# Patient Record
Sex: Female | Born: 1945
Health system: Southern US, Community
[De-identification: ages and names within clinical notes are randomized; demographics above are authoritative.]

## PROBLEM LIST (undated history)

## (undated) DIAGNOSIS — K5790 Diverticulosis of intestine, part unspecified, without perforation or abscess without bleeding: Secondary | ICD-10-CM

## (undated) DIAGNOSIS — N200 Calculus of kidney: Secondary | ICD-10-CM

## (undated) DIAGNOSIS — K589 Irritable bowel syndrome without diarrhea: Secondary | ICD-10-CM

## (undated) DIAGNOSIS — I10 Essential (primary) hypertension: Secondary | ICD-10-CM

## (undated) DIAGNOSIS — K219 Gastro-esophageal reflux disease without esophagitis: Secondary | ICD-10-CM

## (undated) DIAGNOSIS — D649 Anemia, unspecified: Secondary | ICD-10-CM

## (undated) DIAGNOSIS — E785 Hyperlipidemia, unspecified: Secondary | ICD-10-CM

## (undated) DIAGNOSIS — M1990 Unspecified osteoarthritis, unspecified site: Secondary | ICD-10-CM

## (undated) HISTORY — PX: BREAST LUMPECTOMY: SHX2

## (undated) HISTORY — PX: OTHER SURGICAL HISTORY: SHX169

## (undated) HISTORY — DX: Essential (primary) hypertension: I10

## (undated) HISTORY — DX: Gastro-esophageal reflux disease without esophagitis: K21.9

## (undated) HISTORY — DX: Unspecified osteoarthritis, unspecified site: M19.90

## (undated) HISTORY — PX: CYSTOCELE REPAIR: SHX163

## (undated) HISTORY — DX: Calculus of kidney: N20.0

## (undated) HISTORY — DX: Anemia, unspecified: D64.9

## (undated) HISTORY — PX: ABDOMINAL HYSTERECTOMY: SHX81

## (undated) HISTORY — DX: Irritable bowel syndrome without diarrhea: K58.9

## (undated) HISTORY — DX: Hyperlipidemia, unspecified: E78.5

## (undated) HISTORY — DX: Diverticulosis of intestine, part unspecified, without perforation or abscess without bleeding: K57.90

---

## 1968-12-11 HISTORY — PX: ABDOMINAL EXPLORATION SURGERY: SHX538

## 2000-04-27 ENCOUNTER — Encounter: Payer: Self-pay | Admitting: Emergency Medicine

## 2000-04-27 ENCOUNTER — Emergency Department (HOSPITAL_COMMUNITY): Admission: EM | Admit: 2000-04-27 | Discharge: 2000-04-28 | Payer: Self-pay | Admitting: Emergency Medicine

## 2000-06-29 ENCOUNTER — Other Ambulatory Visit: Admission: RE | Admit: 2000-06-29 | Discharge: 2000-06-29 | Payer: Self-pay | Admitting: Obstetrics and Gynecology

## 2000-12-28 ENCOUNTER — Encounter: Payer: Self-pay | Admitting: Gastroenterology

## 2001-07-05 ENCOUNTER — Other Ambulatory Visit: Admission: RE | Admit: 2001-07-05 | Discharge: 2001-07-05 | Payer: Self-pay | Admitting: Obstetrics and Gynecology

## 2001-07-11 ENCOUNTER — Ambulatory Visit (HOSPITAL_COMMUNITY): Admission: RE | Admit: 2001-07-11 | Discharge: 2001-07-11 | Payer: Self-pay | Admitting: Family Medicine

## 2001-07-11 ENCOUNTER — Encounter: Payer: Self-pay | Admitting: Family Medicine

## 2001-12-09 ENCOUNTER — Encounter: Payer: Self-pay | Admitting: Family Medicine

## 2001-12-09 ENCOUNTER — Ambulatory Visit (HOSPITAL_COMMUNITY): Admission: RE | Admit: 2001-12-09 | Discharge: 2001-12-09 | Payer: Self-pay | Admitting: Family Medicine

## 2002-06-09 ENCOUNTER — Encounter: Payer: Self-pay | Admitting: Family Medicine

## 2002-06-09 ENCOUNTER — Ambulatory Visit (HOSPITAL_COMMUNITY): Admission: RE | Admit: 2002-06-09 | Discharge: 2002-06-09 | Payer: Self-pay | Admitting: Family Medicine

## 2002-09-08 ENCOUNTER — Ambulatory Visit (HOSPITAL_COMMUNITY): Admission: RE | Admit: 2002-09-08 | Discharge: 2002-09-08 | Payer: Self-pay | Admitting: Family Medicine

## 2002-09-08 ENCOUNTER — Encounter: Payer: Self-pay | Admitting: Family Medicine

## 2002-10-01 ENCOUNTER — Encounter: Payer: Self-pay | Admitting: Family Medicine

## 2002-10-01 ENCOUNTER — Ambulatory Visit (HOSPITAL_COMMUNITY): Admission: RE | Admit: 2002-10-01 | Discharge: 2002-10-01 | Payer: Self-pay | Admitting: Family Medicine

## 2002-10-17 ENCOUNTER — Encounter: Admission: RE | Admit: 2002-10-17 | Discharge: 2002-10-17 | Payer: Self-pay | Admitting: Oncology

## 2002-10-17 ENCOUNTER — Encounter (HOSPITAL_COMMUNITY): Admission: RE | Admit: 2002-10-17 | Discharge: 2002-11-16 | Payer: Self-pay | Admitting: Oncology

## 2002-12-10 ENCOUNTER — Encounter: Admission: RE | Admit: 2002-12-10 | Discharge: 2002-12-10 | Payer: Self-pay | Admitting: Oncology

## 2002-12-10 ENCOUNTER — Encounter (HOSPITAL_COMMUNITY): Admission: RE | Admit: 2002-12-10 | Discharge: 2003-01-09 | Payer: Self-pay | Admitting: Oncology

## 2003-03-13 ENCOUNTER — Encounter: Admission: RE | Admit: 2003-03-13 | Discharge: 2003-03-13 | Payer: Self-pay | Admitting: Oncology

## 2003-03-13 ENCOUNTER — Encounter (HOSPITAL_COMMUNITY): Admission: RE | Admit: 2003-03-13 | Discharge: 2003-04-12 | Payer: Self-pay | Admitting: Oncology

## 2003-03-23 ENCOUNTER — Encounter: Payer: Self-pay | Admitting: Obstetrics and Gynecology

## 2003-03-23 ENCOUNTER — Ambulatory Visit (HOSPITAL_COMMUNITY): Admission: RE | Admit: 2003-03-23 | Discharge: 2003-03-23 | Payer: Self-pay | Admitting: Obstetrics and Gynecology

## 2003-06-10 ENCOUNTER — Encounter: Admission: RE | Admit: 2003-06-10 | Discharge: 2003-06-10 | Payer: Self-pay | Admitting: Oncology

## 2003-07-14 ENCOUNTER — Encounter: Admission: RE | Admit: 2003-07-14 | Discharge: 2003-07-14 | Payer: Self-pay | Admitting: Oncology

## 2003-08-18 ENCOUNTER — Encounter (HOSPITAL_COMMUNITY): Admission: RE | Admit: 2003-08-18 | Discharge: 2003-09-10 | Payer: Self-pay | Admitting: Oncology

## 2003-08-18 ENCOUNTER — Encounter: Admission: RE | Admit: 2003-08-18 | Discharge: 2003-08-18 | Payer: Self-pay | Admitting: Oncology

## 2003-10-29 ENCOUNTER — Ambulatory Visit (HOSPITAL_COMMUNITY): Admission: RE | Admit: 2003-10-29 | Discharge: 2003-10-29 | Payer: Self-pay | Admitting: Orthopedic Surgery

## 2003-10-29 ENCOUNTER — Ambulatory Visit (HOSPITAL_BASED_OUTPATIENT_CLINIC_OR_DEPARTMENT_OTHER): Admission: RE | Admit: 2003-10-29 | Discharge: 2003-10-29 | Payer: Self-pay | Admitting: Orthopedic Surgery

## 2004-01-13 ENCOUNTER — Ambulatory Visit (HOSPITAL_COMMUNITY): Admission: RE | Admit: 2004-01-13 | Discharge: 2004-01-13 | Payer: Self-pay | Admitting: Family Medicine

## 2004-03-16 ENCOUNTER — Encounter: Admission: RE | Admit: 2004-03-16 | Discharge: 2004-03-16 | Payer: Self-pay | Admitting: Oncology

## 2005-04-06 ENCOUNTER — Ambulatory Visit (HOSPITAL_COMMUNITY): Admission: RE | Admit: 2005-04-06 | Discharge: 2005-04-06 | Payer: Self-pay | Admitting: Obstetrics and Gynecology

## 2006-03-26 ENCOUNTER — Ambulatory Visit (HOSPITAL_COMMUNITY): Admission: RE | Admit: 2006-03-26 | Discharge: 2006-03-26 | Payer: Self-pay | Admitting: Family Medicine

## 2006-05-21 ENCOUNTER — Ambulatory Visit (HOSPITAL_COMMUNITY): Admission: RE | Admit: 2006-05-21 | Discharge: 2006-05-21 | Payer: Self-pay | Admitting: Obstetrics and Gynecology

## 2007-01-30 ENCOUNTER — Emergency Department (HOSPITAL_COMMUNITY): Admission: EM | Admit: 2007-01-30 | Discharge: 2007-01-30 | Payer: Self-pay | Admitting: Emergency Medicine

## 2007-02-02 ENCOUNTER — Emergency Department (HOSPITAL_COMMUNITY): Admission: EM | Admit: 2007-02-02 | Discharge: 2007-02-02 | Payer: Self-pay | Admitting: Emergency Medicine

## 2007-04-12 ENCOUNTER — Inpatient Hospital Stay (HOSPITAL_COMMUNITY): Admission: AD | Admit: 2007-04-12 | Discharge: 2007-04-19 | Payer: Medicare Other | Admitting: Family Medicine

## 2007-04-14 ENCOUNTER — Ambulatory Visit: Payer: Self-pay | Admitting: Internal Medicine

## 2007-04-15 ENCOUNTER — Ambulatory Visit: Payer: Self-pay | Admitting: Internal Medicine

## 2007-04-16 ENCOUNTER — Ambulatory Visit: Payer: Self-pay | Admitting: Internal Medicine

## 2007-07-01 ENCOUNTER — Ambulatory Visit (HOSPITAL_COMMUNITY): Admission: RE | Admit: 2007-07-01 | Discharge: 2007-07-01 | Payer: Self-pay | Admitting: Obstetrics and Gynecology

## 2007-12-09 ENCOUNTER — Encounter (INDEPENDENT_AMBULATORY_CARE_PROVIDER_SITE_OTHER): Payer: Self-pay | Admitting: *Deleted

## 2007-12-09 ENCOUNTER — Ambulatory Visit (HOSPITAL_COMMUNITY): Admission: RE | Admit: 2007-12-09 | Discharge: 2007-12-09 | Payer: Self-pay | Admitting: Family Medicine

## 2008-10-22 ENCOUNTER — Ambulatory Visit (HOSPITAL_COMMUNITY): Admission: RE | Admit: 2008-10-22 | Discharge: 2008-10-22 | Payer: Self-pay | Admitting: Internal Medicine

## 2008-11-20 ENCOUNTER — Encounter (INDEPENDENT_AMBULATORY_CARE_PROVIDER_SITE_OTHER): Payer: Self-pay | Admitting: *Deleted

## 2008-11-20 ENCOUNTER — Ambulatory Visit (HOSPITAL_COMMUNITY): Admission: RE | Admit: 2008-11-20 | Discharge: 2008-11-20 | Payer: Self-pay | Admitting: Family Medicine

## 2009-04-26 ENCOUNTER — Encounter (INDEPENDENT_AMBULATORY_CARE_PROVIDER_SITE_OTHER): Payer: Self-pay | Admitting: *Deleted

## 2009-04-26 ENCOUNTER — Ambulatory Visit (HOSPITAL_COMMUNITY): Admission: RE | Admit: 2009-04-26 | Discharge: 2009-04-26 | Payer: Self-pay | Admitting: Family Medicine

## 2009-04-28 ENCOUNTER — Ambulatory Visit: Payer: Self-pay | Admitting: Internal Medicine

## 2009-04-28 DIAGNOSIS — K589 Irritable bowel syndrome without diarrhea: Secondary | ICD-10-CM | POA: Insufficient documentation

## 2009-04-28 DIAGNOSIS — R1012 Left upper quadrant pain: Secondary | ICD-10-CM | POA: Insufficient documentation

## 2009-04-28 DIAGNOSIS — K219 Gastro-esophageal reflux disease without esophagitis: Secondary | ICD-10-CM | POA: Insufficient documentation

## 2009-05-11 ENCOUNTER — Ambulatory Visit: Payer: Self-pay | Admitting: Internal Medicine

## 2009-05-12 ENCOUNTER — Telehealth (INDEPENDENT_AMBULATORY_CARE_PROVIDER_SITE_OTHER): Payer: Self-pay | Admitting: *Deleted

## 2009-05-12 ENCOUNTER — Telehealth: Payer: Self-pay | Admitting: Internal Medicine

## 2009-05-20 ENCOUNTER — Ambulatory Visit (HOSPITAL_COMMUNITY): Admission: RE | Admit: 2009-05-20 | Discharge: 2009-05-20 | Payer: Self-pay | Admitting: Gastroenterology

## 2009-05-20 ENCOUNTER — Ambulatory Visit: Payer: Self-pay | Admitting: Gastroenterology

## 2010-05-19 ENCOUNTER — Encounter: Admission: RE | Admit: 2010-05-19 | Discharge: 2010-05-19 | Payer: Self-pay | Admitting: Internal Medicine

## 2011-01-01 ENCOUNTER — Encounter: Payer: Self-pay | Admitting: Family Medicine

## 2011-01-01 ENCOUNTER — Encounter: Payer: Self-pay | Admitting: Internal Medicine

## 2011-01-02 ENCOUNTER — Encounter: Payer: Self-pay | Admitting: Family Medicine

## 2011-03-20 LAB — GLUCOSE, CAPILLARY

## 2011-04-28 NOTE — Discharge Summary (Signed)
NAMEGEORGANN, [name] NO.:  [id number]   MEDICAL RECORD NO.:  [id number]          PATIENT TYPE:  INP   LOCATION:  A218                          FACILITY:  [facility]   PHYSICIAN:  Patrica Duel, M.D.    DATE OF BIRTH:  1946/07/26   DATE OF ADMISSION:  04/12/2007  DATE OF DISCHARGE:  05/09/2008LH                               DISCHARGE SUMMARY   DISCHARGE DIAGNOSES:  1. Flank pain of questionable etiology spontaneous resolution on      acyclovir.  2. Apparent nephritis with transient renal failure, baseline renal      function at discharge.  No clear etiology determined.  3. Diabetes mellitus well controlled.  4. Mild hyperlipidemia edema.  5. Mild hypertension.  6. History of recurring epistaxis requiring cautery with no      recurrence.  7. Hormone-replacement therapy.   HISTORY OF PRESENT ILLNESS:  For details regarding admission please  refer to the admitting note.  Briefly, this very pleasant 65 year old  female with the above history developed a pleuritic sensation in her  right flank approximately 5 days prior to admission.  This has  progressed to the point of a debilitating colicky severe pain isolated  to her flank.  She underwent outpatient CT scanning (unenhanced  urogram).  This was totally unrevealing.  Her urine is clear and she was  treated symptomatically.  She called to say that her pain had increased  to the point that it was intolerable.  She was admitted for further  evaluation and therapy as flank pain of unknown etiology.   COURSE IN THE HOSPITAL:  The patient was treated symptomatically.  CT  scan with contrast was obtained which revealed no significant  abnormalities.  An ultrasound was also obtained which was unrevealing.  She had a hepatobiliary scan ordered and she declined the test hoping  for it to be done as an outpatient.  She continued to have nausea and  vomiting, and was treated symptomatically.  GI was consulted and they  had  little to offer.  Quite surprisingly her creatinine went from less  than 1-4.5 in a matter of 3 days.  This was repeated and was accurate.  Dr. Kristian Covey was consulted.  She was treated with high volume fluids and  diuretics and this was very effective and her creatinine promptly  returned to normal after 4 days of hydration, etc.   The patient's pain has resolved, the etiology still remains unclear.  Varicella zoster titers were positive with elevated levels of IgG but  normal IgM.  Whatever the case, this pain has resolved, renal functions  have returned to normal and she is stable for discharge.   Of note the patient has a chronic iron deficiency anemia and this will  be worked up as an outpatient.   DISPOSITION:  1. She will continue Premarin 0.625.  2. Avandamet 2/500 1 twice daily.  3. Metoprolol 50 twice daily.  4. Vytorin 10/20 daily.   FOLLOW-UP:  She will be followed in three weeks as an outpatient.      Patrica Duel, M.D.  Electronically Signed     MC/MEDQ  D:  05/06/2007  T:  05/06/2007  Job:  [id number]

## 2011-04-28 NOTE — Consult Note (Signed)
NAMECAYLEA, [name] NO.:  [id number]   MEDICAL RECORD NO.:  [id number]          PATIENT TYPE:  INP   LOCATION:  A209                          FACILITY:  [facility]   PHYSICIAN:  Jorja Loa, M.D.DATE OF BIRTH:  [birth year]   DATE OF CONSULTATION:  04/16/2007  DATE OF DISCHARGE:                                 CONSULTATION   REASON FOR CONSULTATION:  Elevated BUN and creatinine.  This is a 65year-  old with a history of mild hypertension, history of diabetes and also a  history of hyperlipidemia.  Presently admitted because of flank pain,  poor appetite, some nausea and also some the diarrhea.  She states that  about 4 or 5 days ago she started having some pain at the back which is  not radiating.  This is also associated with some abdominal pain and  severe with nausea, some vomiting, also infrequent diarrhea.  She denies  any fevers, chills or sweating.  She denies any previous history of  renal failure.  However, the patient states that she has previous  history of a kidney stone.   PAST MEDICAL HISTORY:  As stated above, the patient with history of  hypertension, history of diabetes for about 8 years history of  dyslipidemia and also history of previous abdominal pain with  laparotomy.   MEDICATIONS:  Her medications at this moment consist of a Acyclovir 670  mg IV daily, insulin 11 units subcu,  Levaquin 500 mg IV q. 24 hours,  Reglan 10 mg q. 6 hours, Lopressor 50 mg daily and she is getting IV  fluid at 50 cc/hour.  Other medications are on p.r.n. basis.   ALLERGIES:  No known allergies.   SOCIAL HISTORY:  No history of smoking and no history of alcohol abuse.  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   FAMILY HISTORY:  No history of renal insufficiency.   REVIEW OF SYSTEMS:  She is feeling better now but still she has some  back pain, flank pain.  She does not have any nausea or vomiting but  says she has poor appetite and she denies  any fevers, chills or  sweating.  She also denies any diarrhea and no swelling of the legs.   PHYSICAL EXAMINATION:  VITAL SIGNS:  On examination her blood pressure  is 163/83, temperature is 98.5, pulse of 85, respiratory rate is 18.  HEENT EXAM:  No conjunctival pallor.  No icterus.  Oral mucosa seems to  be dry.  NECK:  Supple.  No JVD.  CHEST:  Clear to auscultation.  No rales or rhonchi.  No egophony.  HEART:  Exam revealed regular rate and rhythm.  No murmur.  ABDOMEN:  Soft, positive bowel sounds.  EXTREMITIES:  No edema.   LABORATORY DATA:  Her white blood cell count is 10.9, hemoglobin is  10.9, hematocrit is 31.1.  Her sodium is 141, potassium 4.2, BUN is 28,  creatinine today is 4.57.  Just about 5 days ago creatinine was  0.48  and BUN of 70.  Previously during this admission she  has albumin of 3.1  and protein of 5.5, calcium 7.7.  Her UA specific gravity is 1.65, pH of  6.  She has blood trace, protein trace, few squamous cells.  She has  ultrasound which was done with her abdominal workup, which did not show  any kidney stone.  No hydro and right kidney is 12.6 and left kidney  stones 12.4.   ASSESSMENT:  1. Renal insufficiency. At this moment seems to be acute but there is      a significant change if this is truly her blood work.  The etiology      could be probably a combination of the dehydration associated with      nausea and vomiting and some diarrhea, combined with probably her      Acyclovir.  Even though the patient has history of diabetes at this      moment diabetic nephropathy and hypertensive nephrosclerosis seems      to be less likely.  2. History of diabetes.  She is on insulin.  Blood sugar usually is      controlled very well.  3. History of hypertension.  She is on metoprolol.  Blood pressure      seems to be controlled very well.  4. History of flank pain.  At this moment thought to be secondary to      Herpes Zoster.  She is on Acyclovir.  5.  History of nausea and also back pain, probably from herpes zoster      but other etiologies cannot be ruled out.   RECOMMENDATIONS:  Will repeat her blood work today.  We will increase  her IV fluids to 135 mL per hour.  Will follow her input and output.  Probably at this moment if her renal function continues to deteriorate  we may need to hold the Acyclovir.  We will continue with other  medications and I will follow the patient.      Jorja Loa, M.D.  Electronically Signed     BB/MEDQ  D:  04/16/2007  T:  04/16/2007  Job:  [id number]

## 2011-04-28 NOTE — H&P (Signed)
[name], [name] NO.:  [id number]   MEDICAL RECORD NO.:  [id number]          PATIENT TYPE:  INP   LOCATION:  A209                          FACILITY:  [facility]   PHYSICIAN:  Patrica Duel, M.D.    DATE OF BIRTH:  [birth year]   DATE OF ADMISSION:  04/12/2007  DATE OF DISCHARGE:  LH                              HISTORY & PHYSICAL   CHIEF COMPLAINT:  Flank pain.   HISTORY OF PRESENT ILLNESS:  This is a very pleasant 65 year old female  with a history of diabetes, mild hyperlipidemia, and mild hypertension.  She is currently on hormone replacement therapy as well.  Her most  recent problem has been recurrent episodes of epistaxis, the last  occurring in February 2008.  She was treated by Dr. Ezzard Standing with cautery  and has had no recurrence since.   The patient developed a pruritic sensation of her right flank  approximately 5 days ago.  This has progressed to the point of  debilitating, colicky, severe pain isolated to her flank.  She underwent  outpatient CT scanning (unenhanced urogram).  This was totally  unrevealing.  She was treated symptomatically.   The patient called to report she had increasingly severe pain with  nausea and vomiting possibly medication-related.  She was admitted with  intractable  flank pain of questionable etiology and associated nausea,  vomiting.   CURRENT MEDICATIONS:  1. Premarin 0.625 daily.  2. Avandamet 2/500 one b.i.d.  3. Metoprolol 50 b.i.d.  4. Vytorin 10/20 daily.   PAST HISTORY:  As noted.   SOCIAL HISTORY:  Nonsmoker, nondrinker, healthy lifestyle.   REVIEW OF SYSTEMS:  There is no history of headache, neurologic  deficits, chest pain, shortness of breath, abdominal pain, or  genitourinary symptoms.  Urinalysis in the office yesterday was clear.  Her weight has been stable.   FAMILY HISTORY:  Noncontributory.   PHYSICAL EXAMINATION:  GENERAL:  An extremely pleasant female who is in  some distress but alert and  oriented.  VITAL SIGNS:  Normal.  She is afebrile.  Blood pressure 135/72.  Respirations 18 unlabored.  Blood sugar this morning 129.  HEENT:  Normocephalic, atraumatic.  The pupils are equal.  Ears, nose,  throat benign.  There is no scleral icterus.  NECK: Supple without lymphadenopathy, bruits, thyromegaly, or masses.  LUNGS:  Clear to AP; heart sounds are normal without murmurs, rubs, or  gallops.  ABDOMEN:  The abdomen shows some evolution of right upper quadrant  tenderness.  The liver is nonpalpable.  There is no CVA tenderness.  Bowel sounds are intact.  EXTREMITIES: No clubbing, cyanosis, or edema.  NEUROLOGIC:  Exam is normal limits.   ASSESSMENT:  Flank pain of questionable etiology; it may represent  atypical gallbladder disease, renal infarction though she has had no  hematuria and computed tomographic scan was not suggestive of this.   PLAN:  To obtain an enhanced CT scan and repeat blood work, administer  broad-spectrum antibiotics empirically, and pain control.  This may  represent early shingles or atypical thoracic radicular pain though this  seems unlikely at this time.  We will follow and treat expectantly.      Patrica Duel, M.D.  Electronically Signed     MC/MEDQ  D:  04/13/2007  T:  04/13/2007  Job:  [id number]

## 2011-04-28 NOTE — Consult Note (Signed)
NAMEPENNY, ARRAMBIDE NO.:  [id number]   MEDICAL RECORD NO.:  [id number]           PATIENT TYPE:   LOCATION:                                FACILITY:  [facility]   PHYSICIAN:  Kristine Garbe. Ezzard Standing, M.D. DATE OF BIRTH:   DATE OF CONSULTATION:  02/02/2007  DATE OF DISCHARGE:                                 CONSULTATION   REASON FOR CONSULTATION:  Right-sided epistaxis.   HISTORY:  [name] is a 65 year old female who had previous nosebleed  15-20 years ago.  More recently developed acute nosebleed this past  Wednesday.  Was seen at a local emergency room and had the nose packed  and __________, which stopped the nose bleed.  She subsequently removed  the packing 24 hours later and had further bleeding on Saturday and  presented [facility].  I attempted packing with a  Rhino rocket, did not stop the bleeding totally.   On examination, the patient has a prominent anterior septal vessel on  the right side that was cauterized using silver nitrate.  This  controlled the bleeding.  Remaining nasal passageway was clear.  An  anterior cotton ball packing was placed after cauterization.  The  patient was instructed to remove the packing in two days.  Will follow  up in my office p.r.n. any further problems or bleeding.           ______________________________  Kristine Garbe. Ezzard Standing, M.D.     CEN/MEDQ  D:  02/02/2007  T:  02/02/2007  Job:  [id number]

## 2011-04-28 NOTE — Op Note (Signed)
NAME:  [name], [name]                            ACCOUNT NO.:  [id number]   MEDICAL RECORD NO.:  [id number]                   PATIENT TYPE:  AMB   LOCATION:  DSC                                  FACILITY:  [facility]   PHYSICIAN:  Katy Fitch. Naaman Plummer., M.D.          DATE OF BIRTH:  [birth year]   DATE OF PROCEDURE:  10/29/2003  DATE OF DISCHARGE:                                 OPERATIVE REPORT   PREOPERATIVE DIAGNOSES:  Enlarging mucous cyst, dorsoradial aspect, right  long finger, DIP joint, recurrent.  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   POSTOPERATIVE DIAGNOSES:  Enlarging mucous cyst, dorsoradial aspect, right  long finger, distal interphalangeal joint, recurrent.  Status post previous  distal interphalangeal joint debridement in July 2002 with underlying bone  on bone osteoarthritis of DIP joint.   PROCEDURE:  Second debridement of distal interphalangeal joint, right long  finger with removal of radial dorsal mucous cyst, with capsulotomy and  debridement of marginal osteophytes at distal phalanx base.   SURGEON:  Katy Fitch. Sypher, M.D.   ASSISTANT:  Jonni Sanger, P.A.   ANESTHESIA:  Marcaine 0.25% and 2% Lidocaine, metacarpal head level block,  right long finger.  Supplemental IV sedation.   ANESTHESIOLOGIST:  Maren Beach, M.D.   INDICATIONS FOR PROCEDURE:  [name] is a 65 year old woman who presented  for evaluation of recurrent mucous cyst on her right long finger.  She has  known osteoarthrosis of her long finger DIP joint bilaterally.  She has mild  ulnar deviation and bone on bone arthropathy.   We discussed arthrodesis and alternatives to simple debridement. As she  still maintained 80 degrees motion of her DIP joint, she prefers at this  time to schedule a second debridement.  After informed consent, the patient was brought to the operating room at  this time.   DESCRIPTION  OF PROCEDURE:  [name] was brought to the operating room and  placed in the supine position on the operating table. Following light  sedation, the right arm was prepped with Betadine soap solution and  sterilely draped. Then 0.25% Marcaine and 2% lidocaine were infiltrated to  the metacarpal head level, to obtain digital block.  When anesthesia was  satisfactory, the arm was prepped with Betadine soap solution and sterilely  draped.  The finger was exsanguinated with gauze wrap and a digital  tourniquet placed with a small piece of Esmarch at the base of P1.   The procedure commenced with a lazy S-incision, exposing the extensor  mechanism and capsule. The cyst was circumferentially dissected and debrided  of all mucinous material.  The capsule between the central slip and  radiocollateral ligament was resected. The capsule between the central slip  and ulnocollateral ligament was resected.  Marginal osteophytes were removed  with a micro-curet, followed by irrigation of  the joint with a 20 cc syringe  and an 18 gauge needle.  The wound was then repaired with interrupted  sutures of 5-0 nylon.  A compressive dressing was applied, with Xeroflo,  sterile gauze and Coban.  There were no operative complications.   For after-care, [name] was given an prescription for Darvocet N 100, 1-2  p.o. q.4-6h. p.r.n. pain. Also, Keflex 500 mg 1 p.o. q.8h. x4 days as a  prophylactic antibiotic.                                               Katy Fitch Naaman Plummer., M.D.    RVS/MEDQ  D:  10/29/2003  T:  10/30/2003  Job:  [id number]

## 2011-04-28 NOTE — Consult Note (Signed)
[name], Dodson                  ACCOUNT NO.:  [id number]   MEDICAL RECORD NO.:  [id number]          PATIENT TYPE:  INP   LOCATION:  A209                          FACILITY:  [facility]   PHYSICIAN:  Lionel December, M.D.    DATE OF BIRTH:  08-Jan-1946   DATE OF CONSULTATION:  04/14/2007  DATE OF DISCHARGE:                                 CONSULTATION   REASON FOR CONSULTATION:  Intractable nausea and vomiting.  The patient  admitted with left lumbar pain.   HISTORY OF PRESENT ILLNESS:  [name] is a 65 year old Caucasian female who  was in her usual state of health until the evening of April 08, 2007  when she suddenly developed pain in her left lower lumbar area.  Pain  was quite intractable without radiation into her lower extremity.  She  took her husband's Tylox with some relief.  However, pain has continued  and become more intense.  She was seen by Dr. Nobie Putnam.  She had  unenhanced abdominal which was negative for urolithiasis.  It was felt  that she may have herpes ulcer, but she has not developed any rash.  Two  days ago she began to have nausea and vomiting which has continued to  date despite therapy with promethazine and Zofran.  Her pain is  constant, unrelenting and has not changed since its onset.  She denies  diarrhea, melena or rectal bleeding.  Similarly she denies dysuria or  hematuria.  Her last BM was 2 or 3 days ago.  She has not experienced  fever or chills.  She also denies recent weight loss.   The patient had another abdominopelvic CT with oral and IV contrast  which was negative for any abnormality to account for symptomatology.   MEDICATIONS:  She is presently on:  1. NovoLog via sliding scale.  2. Levaquin 5 mg IV q.24h.  3. Acyclovir 670 mg IV q.8h.  4. Lidoderm patch was initiated today.  5. Metoprolol 50 mg b.i.d.  6. She is also on p.r.n. medications per [facility] protocol.   At home medications include:  1. Premarin 0.625 mg daily.  2. Avandamet 2/500  b.i.d.  3. Metoprolol 50 mg b.i.d.  4. Vytorin 10/20 daily   PAST MEDICAL HISTORY:  1. She has been diabetic for 8 years.  Has been well-controlled.  Her      hemoglobin A1C was either 6.5 or 6.9.  2. She has been hypertensive for about 8 years.  3. History of kidney stones which she has passed spontaneously and      these have not been analyzed.  4. She had laparotomy in 1971 for presumed pneumoperitoneum but no      abnormality was found.  5. She had benign lump removed from her left breast in 1989.  6. She had hysterectomy with bilateral salpingo-oophorectomy in 1984.  7. She has had three colonoscopies in the last 1 to 6 years ago and by      report were normal.   ALLERGIES:  None known.   FAMILY HISTORY:  Mother died at 70 of possible  ovarian primary with  liver metastases.  Father died of MI at age 55.  She has two brothers  and one sister living.  One brother died of MI at age 39.  Another  brother is 4 and had a stroke at 26 and has some deficit.   SOCIAL HISTORY:  She is married.  She has one daughter.  She is an Astronomer.  She worked [employer] but presently working at [employer].  She has never smoked cigarettes and does not drink  alcohol.   PHYSICAL EXAMINATION:  GENERAL:  Well-developed, well-nourished  Caucasian female who appears to be acutely ill and she is holding an  emesis basin.  VITAL SIGNS:  Admission weight 67.6 kg.  She 62 inches  tall.  Pulse 74 per minute, blood pressure 118/85, temperature is 98.7,  respirations 19.  HEENT:  Conjunctivae pink.  Sclerae nonicteric.  Oropharyngeal mucosa is  normal.  NECK:  Supple.  No neck masses or thyromegaly noted.  CARDIAC:  Regular rhythm.  Normal S1 and S2.  No murmur or gallop noted.  LUNGS:  Clear to auscultation.  ABDOMEN:  Her abdomen is symmetrical.  Bowel sounds are normal.  Palpation reveals soft abdomen without tenderness, organomegaly or  masses.  BACK:  No rash noted over  her back.  No tenderness noted over renal  angles.  She has localized tenderness in left lower lumbar area above  level of iliac crest.  RECTAL:  Examination deferred.  EXTREMITIES:  No clubbing or edema noted.   ADMISSION LABORATORY DATA:  WBC 8.1, hemoglobin 10.3, hematocrit 30.6,  platelet count 415,000.  Her sodium was 138, potassium 3.9, chloride  101, CO2 29, glucose 211, BUN 11, creatinine 0.48.  LFTs from yesterday  show bilirubin is 0.7, AP 44, AST 17, ALT 15, total protein 6, albumin  3.5.   Labs from this morning:  WBC 7.6, hemoglobin 9.8, hematocrit 28.5,  platelet count is 359,000.  Her amylase and lipase from this morning are  44 and 14 respectively.  Urinalysis negative for nitrates, leukocytes or  red cells, but had trace ketones.  Abdominopelvic CT with oral and IV  contrast negative.   ASSESSMENT:  [name] is a  65 year old Caucasian female who presents with  unrelenting pain in the left lower lumbar region.  Herpes zoster is  suspected and she is on Acyclovir, but she has not developed any rash  and neither has she improved as far as symptoms are concerned.  Now she  has developed intractable nausea and vomiting, not responding to Zofran  and/or Phenergan.  I suspect her nausea, vomiting is due to acute  gastroparesis, probably related to narcotic therapy, although she could  have borderline gastric emptying to begin with given history of diabetes  for 8 years.  I doubt that this is hepatobiliary.  Ultrasound is planned  for the morning.   As far as her left lumbar pain is concerned, since she has not developed  a rash to suggest herpes zoster, we need to be looking for other  etiologies such as disk prolapse with nerve root compression or even  subtle fracture.   RECOMMENDATIONS:  1. Start on Reglan 10 mg IV q.i.d.  2. As discussed with Dr. Nobie Putnam, MRI of low dorsal and lumbar spine      will be obtained in a.m..  We appreciate the opportunity to participate  in the care of this nice  lady.      [employer],  M.D.  Electronically Signed     NR/MEDQ  D:  04/15/2007  T:  04/15/2007  Job:  [id number]

## 2011-06-09 ENCOUNTER — Ambulatory Visit: Payer: Self-pay | Admitting: Internal Medicine

## 2011-06-12 ENCOUNTER — Other Ambulatory Visit: Payer: Self-pay | Admitting: Family Medicine

## 2011-06-12 ENCOUNTER — Other Ambulatory Visit: Payer: Self-pay | Admitting: Urology

## 2011-06-12 ENCOUNTER — Other Ambulatory Visit (HOSPITAL_COMMUNITY): Payer: Self-pay | Admitting: Urology

## 2011-06-12 ENCOUNTER — Encounter (HOSPITAL_COMMUNITY): Payer: Medicare Other

## 2011-06-12 ENCOUNTER — Ambulatory Visit (HOSPITAL_COMMUNITY)
Admission: RE | Admit: 2011-06-12 | Discharge: 2011-06-12 | Disposition: A | Discharge status: Home / Self Care | Payer: Medicare Other | Source: Ambulatory Visit | Attending: Urology | Admitting: Urology

## 2011-06-12 DIAGNOSIS — IMO0002 Reserved for concepts with insufficient information to code with codable children: Secondary | ICD-10-CM

## 2011-06-12 DIAGNOSIS — N816 Rectocele: Secondary | ICD-10-CM | POA: Insufficient documentation

## 2011-06-12 DIAGNOSIS — I10 Essential (primary) hypertension: Secondary | ICD-10-CM | POA: Insufficient documentation

## 2011-06-12 DIAGNOSIS — E119 Type 2 diabetes mellitus without complications: Secondary | ICD-10-CM | POA: Insufficient documentation

## 2011-06-12 DIAGNOSIS — Z01812 Encounter for preprocedural laboratory examination: Secondary | ICD-10-CM | POA: Insufficient documentation

## 2011-06-12 DIAGNOSIS — Z01811 Encounter for preprocedural respiratory examination: Secondary | ICD-10-CM | POA: Insufficient documentation

## 2011-06-12 DIAGNOSIS — Z01810 Encounter for preprocedural cardiovascular examination: Secondary | ICD-10-CM | POA: Insufficient documentation

## 2011-06-12 DIAGNOSIS — N8111 Cystocele, midline: Secondary | ICD-10-CM | POA: Insufficient documentation

## 2011-06-12 LAB — BASIC METABOLIC PANEL
Calcium: 9.9 mg/dL (ref 8.4–10.5)
Creatinine, Ser: 0.5 mg/dL (ref 0.50–1.10)
GFR calc non Af Amer: >60 mL/min (ref >60)
Glucose, Bld: 116 mg/dL — ABNORMAL HIGH (ref 70–99)
Sodium: 140 mEq/L (ref 135–145)

## 2011-06-12 LAB — PROTIME-INR: Prothrombin Time: 13.6 seconds (ref 11.6–15.2)

## 2011-06-12 LAB — APTT: aPTT: 36 seconds (ref 24–37)

## 2011-06-12 LAB — SURGICAL PCR SCREEN: Staphylococcus aureus: NEGATIVE

## 2011-06-12 LAB — CBC
MCH: 28.5 pg (ref 26.0–34.0)
Platelets: 366 10*3/uL (ref 150–400)
RBC: 4.07 MIL/uL (ref 3.87–5.11)
RDW: 13 % (ref 11.5–15.5)
WBC: 7.8 10*3/uL (ref 4.0–10.5)

## 2011-06-19 ENCOUNTER — Observation Stay (HOSPITAL_COMMUNITY)
Admission: RE | Admit: 2011-06-19 | Discharge: 2011-06-20 | Disposition: A | Discharge status: Home / Self Care | Payer: Medicare Other | Source: Ambulatory Visit | Attending: Urology | Admitting: Urology

## 2011-06-19 DIAGNOSIS — N8111 Cystocele, midline: Principal | ICD-10-CM | POA: Insufficient documentation

## 2011-06-19 DIAGNOSIS — N393 Stress incontinence (female) (male): Secondary | ICD-10-CM | POA: Insufficient documentation

## 2011-06-19 DIAGNOSIS — N816 Rectocele: Secondary | ICD-10-CM | POA: Insufficient documentation

## 2011-06-19 DIAGNOSIS — Z01812 Encounter for preprocedural laboratory examination: Secondary | ICD-10-CM | POA: Insufficient documentation

## 2011-06-19 DIAGNOSIS — E119 Type 2 diabetes mellitus without complications: Secondary | ICD-10-CM | POA: Insufficient documentation

## 2011-06-19 DIAGNOSIS — Z01810 Encounter for preprocedural cardiovascular examination: Secondary | ICD-10-CM | POA: Insufficient documentation

## 2011-06-19 DIAGNOSIS — I10 Essential (primary) hypertension: Secondary | ICD-10-CM | POA: Insufficient documentation

## 2011-06-19 LAB — TYPE AND SCREEN

## 2011-06-19 LAB — GLUCOSE, CAPILLARY
Glucose-Capillary: 69 mg/dL — ABNORMAL LOW (ref 70–99)
Glucose-Capillary: 80 mg/dL (ref 70–99)
Glucose-Capillary: 76 mg/dL (ref 70–99)

## 2011-06-19 LAB — BASIC METABOLIC PANEL
CO2: 28 mEq/L (ref 19–32)
Calcium: 8.8 mg/dL (ref 8.4–10.5)
Creatinine, Ser: 0.52 mg/dL (ref 0.50–1.10)
GFR calc Af Amer: >60 mL/min (ref >60)
GFR calc non Af Amer: >60 mL/min (ref >60)
Sodium: 137 mEq/L (ref 135–145)

## 2011-06-19 LAB — ABO/RH: ABO/RH(D): A NEG

## 2011-06-19 LAB — HEMOGLOBIN AND HEMATOCRIT, BLOOD
HCT: 30.4 % — ABNORMAL LOW (ref 36.0–46.0)
Hemoglobin: 9.9 g/dL — ABNORMAL LOW (ref 12.0–15.0)

## 2011-06-20 LAB — BASIC METABOLIC PANEL
BUN: 10 mg/dL (ref 6–23)
CO2: 27 mEq/L (ref 19–32)
Calcium: 8.2 mg/dL — ABNORMAL LOW (ref 8.4–10.5)
Chloride: 99 mEq/L (ref 96–112)
Creatinine, Ser: 0.57 mg/dL (ref 0.50–1.10)
GFR calc Af Amer: >60 mL/min (ref >60)
GFR calc non Af Amer: >60 mL/min (ref >60)
Potassium: 4.2 mEq/L (ref 3.5–5.1)
Sodium: 135 mEq/L (ref 135–145)

## 2011-06-20 LAB — HEMOGLOBIN AND HEMATOCRIT, BLOOD: HCT: 30.8 % — ABNORMAL LOW (ref 36.0–46.0)

## 2011-06-27 NOTE — Op Note (Signed)
[name] NO.:  [id number]  MEDICAL RECORD NO.:  [id number]  LOCATION:  DAYL                         FACILITY:  [facility]  PHYSICIAN:  Kimberly Sinner, MD DATE OF BIRTH:  1946/07/22  DATE OF PROCEDURE:  06/19/2011 DATE OF DISCHARGE:                              OPERATIVE REPORT   PREOPERATIVE DIAGNOSES:  Vault prolapse, cystocele, small rectocele.  POSTOPERATIVE DIAGNOSES:  Vault prolapse, cystocele, small rectocele.  PROCEDURES:  Vault suspension plus cystocele repair plus graft plus cystoscopy.  SURGEON:  Jordie Schreur A. Mallie Giambra, M.D.  ASSISTANT:  Delia Chimes, NP.  DESCRIPTION OF PROCEDURE:  [name] has the above the above diagnosis.  [name].  Preoperative antibiotics were given.  Preoperative laboratory tests were normal. Extra care was taken with leg positioning to minimize the risk of compartment syndrome, neuropathy and DVT.  Her vaginal cuff descended from 8 or 9 cm to approximately 4 or 5 cm.  She had an impressively long anterior vaginal wall with a grade 3 cystocele that was primarily central and a little bit of lateral defect.  A 3-0 Vicryl was placed at the vaginal cuff.  I instilled approximately 25 mL of lidocaine epinephrine mixture submucosally.  I made my usual anterior vaginal wall T-shaped incision after marking the vaginal cuff with a 3-0 Vicryl at the dimples.  I sharply dissected to the white line bilaterally and mobilized the bladder nicely at the vaginal apex.  At the beginning of the case with the cystocele and vault reduced, it did not look that she would require posterior repair.  After the above dissection, I did a two-layer anterior repair not distorting the anatomy in keeping good anterior length on the pubocervical fascia.  2-0 Vicryl was utilized.  I then cystoscoped the patient.  There were excellent blue jets bilaterally.  There was a cystocele in the middle.  There was no  injury to bladder or urethra.  With the bladder emptied, I bluntly dissected at the apex to the ischial spine bilaterally sweeping all soft tissue medially and I could feel the sacrospinous ligament bilaterally.  With the catheter device, I placed a #0 Ethibond one fingerbreadth medial to the ischial spine and a straight line between the spines in both sides.  I double and triple checked their position and was very pleased them.  I did a total of 2 rectal examinations and there was no injury to rectum.  Sutures were outside the rectum.  Using a UR-6 needle, I placed a 0 Vicryl at the urethrovesical angle and into the pelvic sidewall bilaterally.  I cut the 10 x 6 dermal graft and shaped like a trapezoid and sewed in place with my 4 sutures.  This laid in very nicely.  I attached the vaginal apex with the 2-0 Vicryl to the cephalad aspect of the graft in the midline.  I trimmed a moderate amount of redundant long anterior vaginal wall mucosa in a staged manner not to over excise.  I closed a running anterior vaginal wall with 0 Vicryl on a CT1 needle.  I was very pleased with the vaginal length and support of the  apex and anterior vaginal wall at the end of the case.  Visually, she had minimal rectocele.  When I did a rectal examination, I she had some mobility posteriorly, especially in the middle aspect of the vagina, but not near the introitus.  I felt the rectocele repair was not necessary.  Vaginal pack with Estrace cream was inserted.  Leg position was good at the end of the case.  Urine output was good.  Blood loss was less than 150 mL.          ______________________________ Kimberly Sinner, MD     SAM/MEDQ  D:  06/19/2011  T:  06/19/2011  Job:  [id number]  Electronically Signed by Alfredo Martinez MD on 06/27/2011 08:14:52 AM

## 2011-09-01 ENCOUNTER — Other Ambulatory Visit: Payer: Self-pay | Admitting: Internal Medicine

## 2011-09-01 DIAGNOSIS — Z1231 Encounter for screening mammogram for malignant neoplasm of breast: Secondary | ICD-10-CM

## 2011-09-08 ENCOUNTER — Ambulatory Visit: Payer: Medicare Other

## 2011-09-14 ENCOUNTER — Ambulatory Visit
Admission: RE | Admit: 2011-09-14 | Discharge: 2011-09-14 | Disposition: A | Discharge status: Home / Self Care | Payer: Medicare Other | Source: Ambulatory Visit | Attending: Internal Medicine | Admitting: Internal Medicine

## 2011-09-14 DIAGNOSIS — Z1231 Encounter for screening mammogram for malignant neoplasm of breast: Secondary | ICD-10-CM

## 2012-02-02 ENCOUNTER — Ambulatory Visit (INDEPENDENT_AMBULATORY_CARE_PROVIDER_SITE_OTHER): Payer: Medicare Other | Admitting: Internal Medicine

## 2012-02-02 ENCOUNTER — Encounter: Payer: Self-pay | Admitting: Internal Medicine

## 2012-02-02 VITALS — BP 98/50 | HR 68 | Ht 62.0 in | Wt 150.0 lb

## 2012-02-02 DIAGNOSIS — Z1211 Encounter for screening for malignant neoplasm of colon: Secondary | ICD-10-CM

## 2012-02-02 DIAGNOSIS — R1012 Left upper quadrant pain: Secondary | ICD-10-CM

## 2012-02-02 NOTE — Progress Notes (Signed)
HISTORY OF PRESENT ILLNESS:  [name] is a 66 y.o. female with hypertension, hyperlipidemia, diabetes mellitus, chronic anemia, osteoarthritis, nephrolithiasis, and diarrhea predominate irritable bowel syndrome. She has undergone prior colonoscopy with Dr. Karilyn Cota in 1995 and Dr. Arlyce Dice 2002. These were unremarkable except for left-sided diverticulosis. She refers herself today regarding a 2-1/2 month history of left upper quadrant/sides/back discomfort. She describes this as being eating or burning. It is constant. No exacerbating or relieving factors reported. No nausea, vomiting, or change in bowel habits. She is concerned that it may represent shingles. She denies having had this problem before. However, I saw her on 04/28/2009 for the exact same pain. I read the office note to her. She did agree that it was the same pain. Her discomfort was felt to be musculoskeletal. However, she did undergo upper endoscopy (which was unremarkable). Additionally, negative endoscopic ultrasound (because of some weight loss). Is also concerned about possible "lump" in her left lower abdomen that she notices sometimes when standing.  REVIEW OF SYSTEMS:  All non-GI ROS negative except for sinus trouble, arthritis, cough, nosebleeds  Past Medical History  Diagnosis Date  . Anemia   . Arthritis   . Diabetes mellitus   . Hyperlipidemia   . Hypertension   . IBS (irritable bowel syndrome)   . Kidney stones   . Diverticulosis     Past Surgical History  Procedure Date  . Abdominal hysterectomy   . Breast lumpectomy   . Abdominal exploration surgery 1970  . Cystocele repair   . Skin graph     Social History [name]  reports that she has never smoked. She does not have any smokeless tobacco history on file. She reports that she does not drink alcohol or use illicit drugs.  family history includes Diabetes in her father and mother; Heart disease in her brother and father; Liver cancer in her mother;  and Ovarian cancer in her mother.  No Known Allergies     PHYSICAL EXAMINATION: Vital signs: BP 98/50  Pulse 68  Ht 5\' 2"  (1.575 m)  Wt 150 lb (68.04 kg)  BMI 27.44 kg/m2  Constitutional: generally well-appearing, no acute distress Psychiatric: alert and oriented x3, cooperative Eyes: extraocular movements intact, anicteric, conjunctiva pink Mouth: oral pharynx moist, no lesions Neck: supple no lymphadenopathy Cardiovascular: heart regular rate and rhythm, no murmur Lungs: clear to auscultation bilaterally Abdomen: soft, nontender, nondistended, no obvious ascites, no peritoneal signs, normal bowel sounds, no organomegaly. No mass. Examined lying and standing Rectal:deferred until colonoscopy Extremities: no lower extremity edema bilaterally Skin: no lesions on visible extremities Neuro: No focal deficits.   ASSESSMENT:  #1. Left upper quadrant/side/back discomfort. Most consistent with musculoskeletal etiology. Has had previously. She is mostly anxious. #2. Colon cancer screening. Last colonoscopy 2002. Due for routine followup #3. General medical problems including diabetes mellitus   PLAN:  #1. Reassurance #2. If the discomfort is significant, see PCP regarding symptomatic therapies #3. Screening colonoscopy recommended.The nature of the procedure, as well as the risks, benefits, and alternatives were carefully and thoroughly reviewed with the patient. Ample time for discussion and questions allowed. The patient understood, was satisfied, and agreed to proceed. She states that she will call back to schedule the exam, after consulting her schedule book. #4. Hold diabetic medications the morning of the examination until resuming normal by mouth intake

## 2012-02-02 NOTE — Patient Instructions (Signed)
Please call the office at [phone] to schedule your colonoscopy as soon as you have reviewed your schedule

## 2012-04-16 ENCOUNTER — Ambulatory Visit (HOSPITAL_COMMUNITY)
Admission: RE | Admit: 2012-04-16 | Discharge: 2012-04-16 | Disposition: A | Discharge status: Home / Self Care | Payer: Medicare Other | Source: Ambulatory Visit | Attending: Internal Medicine | Admitting: Internal Medicine

## 2012-04-16 ENCOUNTER — Other Ambulatory Visit (HOSPITAL_COMMUNITY): Payer: Self-pay | Admitting: Internal Medicine

## 2012-04-16 DIAGNOSIS — M545 Low back pain, unspecified: Secondary | ICD-10-CM | POA: Insufficient documentation

## 2012-04-16 DIAGNOSIS — M549 Dorsalgia, unspecified: Secondary | ICD-10-CM

## 2012-04-17 ENCOUNTER — Ambulatory Visit
Admission: RE | Admit: 2012-04-17 | Discharge: 2012-04-17 | Disposition: A | Discharge status: Home / Self Care | Payer: Medicare Other | Source: Ambulatory Visit | Attending: Internal Medicine | Admitting: Internal Medicine

## 2012-04-17 ENCOUNTER — Other Ambulatory Visit: Payer: Self-pay | Admitting: Internal Medicine

## 2012-04-17 DIAGNOSIS — M545 Low back pain, unspecified: Secondary | ICD-10-CM

## 2012-05-07 ENCOUNTER — Encounter: Payer: Self-pay | Admitting: Internal Medicine

## 2012-06-12 ENCOUNTER — Other Ambulatory Visit (HOSPITAL_COMMUNITY): Payer: Self-pay | Admitting: Internal Medicine

## 2012-06-12 DIAGNOSIS — R1011 Right upper quadrant pain: Secondary | ICD-10-CM

## 2012-06-18 ENCOUNTER — Ambulatory Visit (HOSPITAL_COMMUNITY)
Admission: RE | Admit: 2012-06-18 | Discharge: 2012-06-18 | Disposition: A | Discharge status: Home / Self Care | Payer: Medicare Other | Source: Ambulatory Visit | Attending: Internal Medicine | Admitting: Internal Medicine

## 2012-06-18 ENCOUNTER — Other Ambulatory Visit (HOSPITAL_COMMUNITY): Payer: Self-pay | Admitting: Internal Medicine

## 2012-06-18 DIAGNOSIS — R1011 Right upper quadrant pain: Secondary | ICD-10-CM

## 2012-06-20 ENCOUNTER — Other Ambulatory Visit (HOSPITAL_COMMUNITY): Payer: Self-pay | Admitting: Internal Medicine

## 2012-06-20 DIAGNOSIS — R109 Unspecified abdominal pain: Secondary | ICD-10-CM

## 2012-06-21 ENCOUNTER — Encounter (HOSPITAL_COMMUNITY): Payer: 59

## 2012-06-24 ENCOUNTER — Ambulatory Visit (HOSPITAL_COMMUNITY): Payer: 59

## 2012-06-25 ENCOUNTER — Encounter (HOSPITAL_COMMUNITY): Payer: Self-pay

## 2012-06-25 ENCOUNTER — Encounter (HOSPITAL_COMMUNITY)
Admission: RE | Admit: 2012-06-25 | Discharge: 2012-06-25 | Disposition: A | Discharge status: Home / Self Care | Payer: Medicare Other | Source: Ambulatory Visit | Attending: Internal Medicine | Admitting: Internal Medicine

## 2012-06-25 DIAGNOSIS — R932 Abnormal findings on diagnostic imaging of liver and biliary tract: Secondary | ICD-10-CM | POA: Insufficient documentation

## 2012-06-25 DIAGNOSIS — R109 Unspecified abdominal pain: Secondary | ICD-10-CM | POA: Insufficient documentation

## 2012-06-25 MED ORDER — TECHNETIUM TC 99M MEBROFENIN IV KIT
5.0000 | PACK | Freq: Once | INTRAVENOUS | Status: AC | PRN
  Administered 2012-06-25: 5.4 via INTRAVENOUS

## 2012-06-25 MED ORDER — SINCALIDE 5 MCG IJ SOLR
INTRAMUSCULAR | Status: AC
  Administered 2012-06-25: 1.32 ug
  Filled 2012-06-25: qty 5

## 2012-07-01 IMAGING — MG MM DIGITAL SCREENING {BCG}
5 series · 5 of 5 positions shown · non-contrast
Comparison: Prior studies.

DG SCREEN MAMMOGRAM BILATERAL
Bilateral CC and MLO view(s) were taken.

DIGITAL SCREENING MAMMOGRAM WITH CAD:

[R CC]
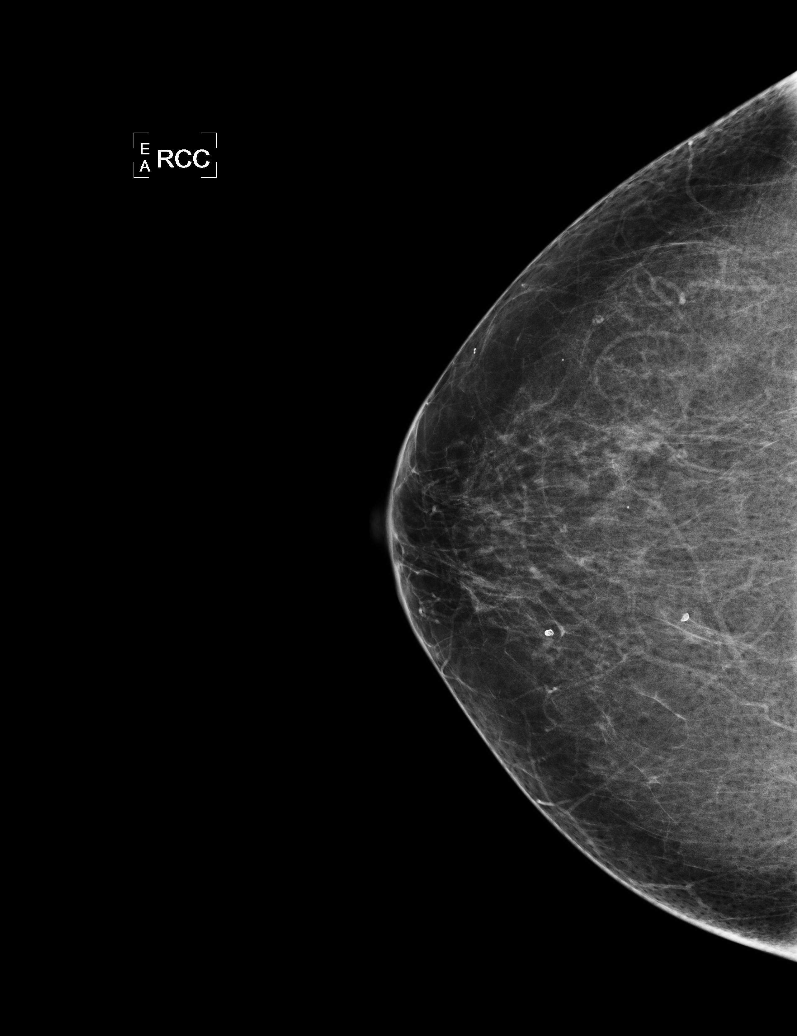

[L CC]
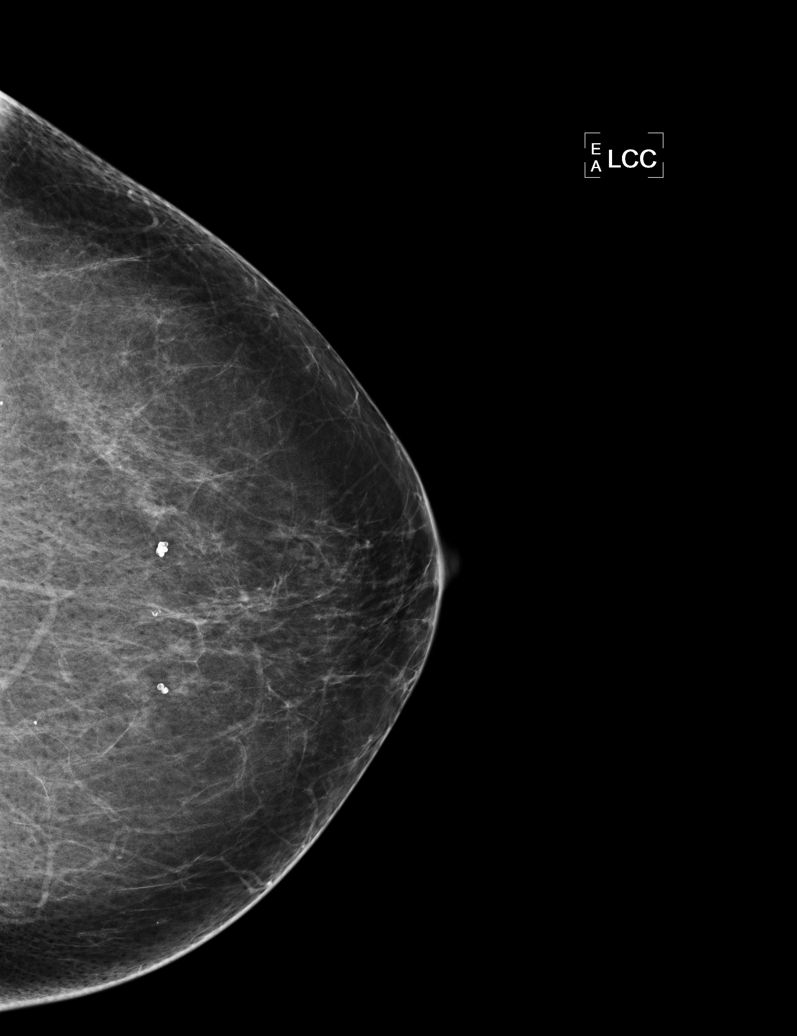

[L MLO]
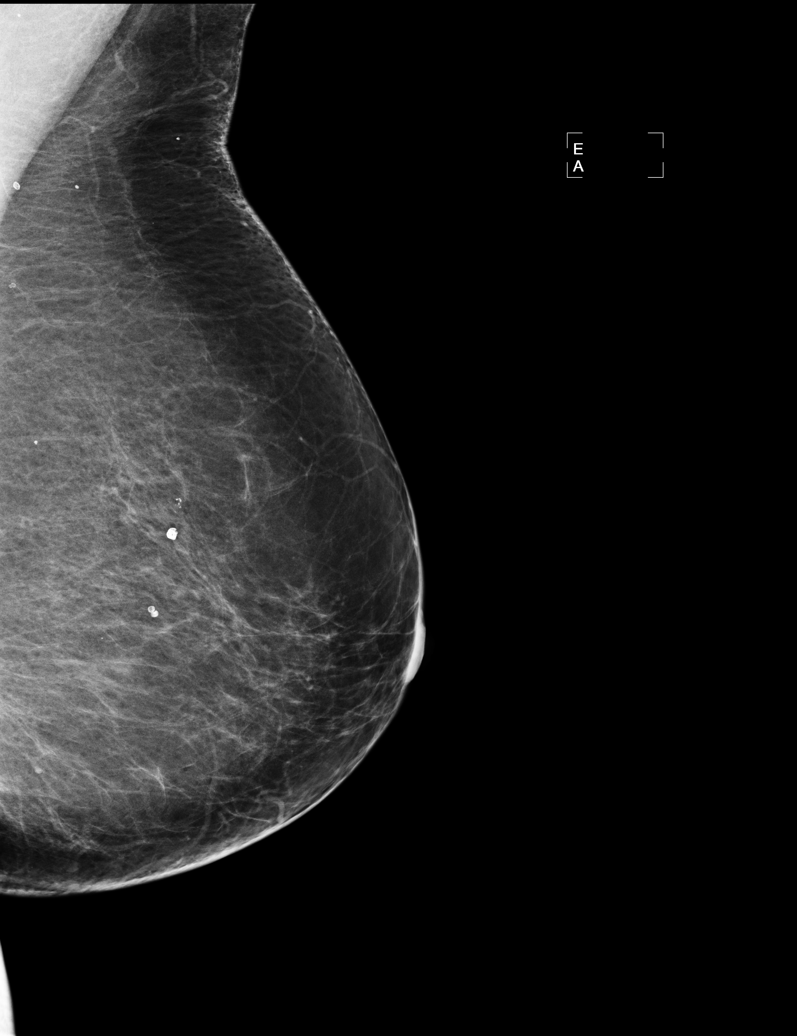

[R MLO (1 of 2)]
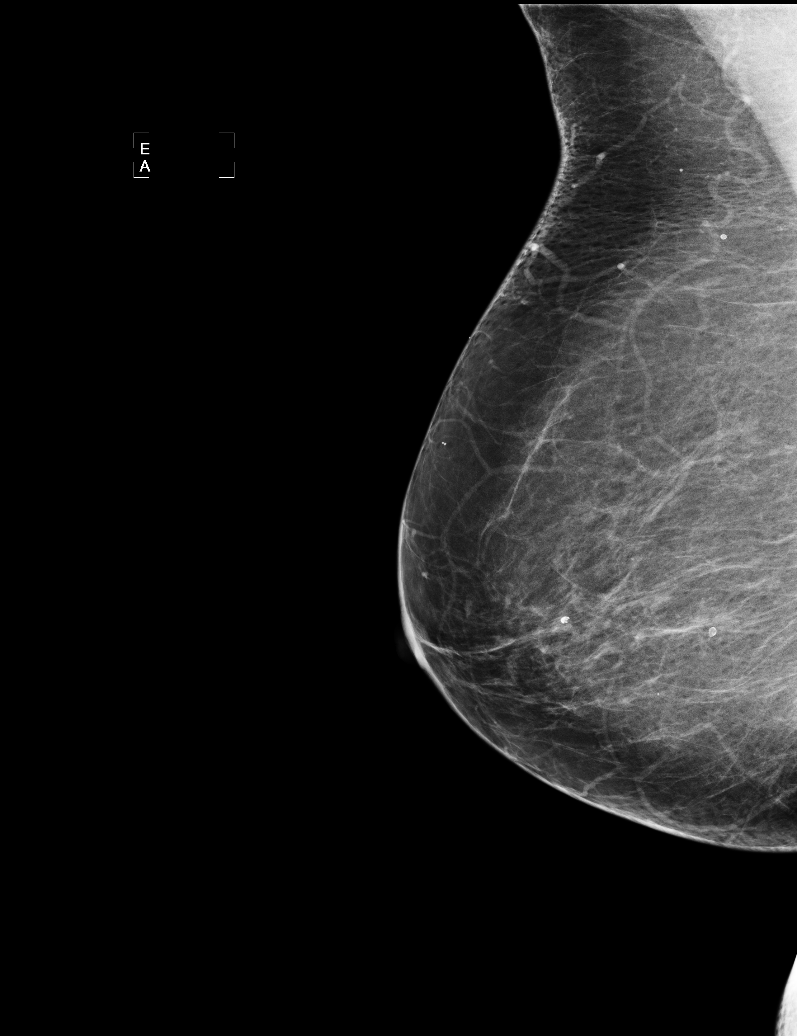

[R MLO (2 of 2)]
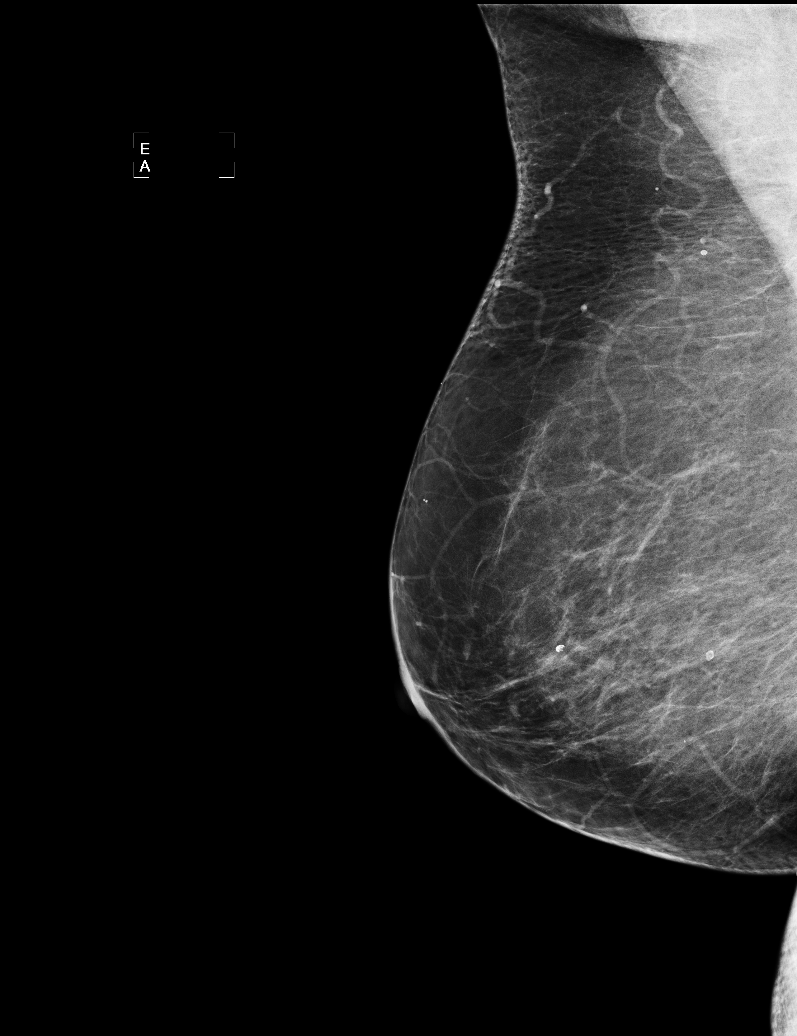

[5 of 5 positions shown; findings below may reference images not displayed]

There are scattered fibroglandular densities.  There is no dominant mass, architectural distortion 
or calcification to suggest malignancy.

Images were processed with CAD.
IMPRESSION: No mammographic evidence of malignancy.  Suggest yearly screening mammography.

A result letter of this screening mammogram will be mailed directly to the patient.

ASSESSMENT: Negative - BI-RADS 1

Screening mammogram in 1 year.
,

## 2012-07-12 ENCOUNTER — Encounter: Payer: Medicare Other | Admitting: Internal Medicine

## 2012-07-29 ENCOUNTER — Ambulatory Visit (INDEPENDENT_AMBULATORY_CARE_PROVIDER_SITE_OTHER): Payer: Medicare Other | Admitting: General Surgery

## 2012-07-29 ENCOUNTER — Encounter (INDEPENDENT_AMBULATORY_CARE_PROVIDER_SITE_OTHER): Payer: Self-pay | Admitting: General Surgery

## 2012-07-29 VITALS — BP 132/84 | HR 68 | Temp 97.7°F | Ht 62.0 in | Wt 144.0 lb

## 2012-07-29 DIAGNOSIS — K828 Other specified diseases of gallbladder: Secondary | ICD-10-CM | POA: Insufficient documentation

## 2012-07-29 NOTE — Patient Instructions (Signed)
Stay on a strict lowfat diet.

## 2012-07-29 NOTE — Progress Notes (Signed)
Patient ID: [name], female   DOB: [birth year], 66 y.o.   MRN: [id number]  Chief Complaint  Patient presents with  . Pre-op Exam    eval gallbladder    HPI [name] is a 66 y.o. female.   HPI  She is referred by Dr. Margo Dodson for evaluation of postprandial right upper quadrant pain and gallbladder dysfunction. She's not had any problems until recently. She ate a piece of pizza and had severe pressure-type right upper quadrant pain with a little nausea. When she eats a fatty meal she gets similar type discomfort. An ultrasound demonstrated no evidence of gallstones or gallbladder wall thickening. Common bile duct diameter was normal. A nuclear medicine hepatobiliary scan demonstrated a gallbladder ejection fraction of 7% which is significantly abnormal. These findings are consistent with gallbladder/biliary dyskinesia and she presents here for evaluation of that. No significant  family history of gallbladder disease.  Past Medical History  Diagnosis Date  . Anemia   . Arthritis   . Diabetes mellitus   . Hyperlipidemia   . Hypertension   . IBS (irritable bowel syndrome)   . Kidney stones   . Diverticulosis   . GERD (gastroesophageal reflux disease)     Past Surgical History  Procedure Date  . Abdominal hysterectomy   . Breast lumpectomy   . Abdominal exploration surgery 1970  . Cystocele repair   . Skin graph     Family History  Problem Relation Age of Onset  . Liver cancer Mother   . Ovarian cancer Mother   . Diabetes Mother   . Cancer Mother     ovarian  . Diabetes Father   . Heart disease Father   . Heart disease Brother     Social History History  Substance Use Topics  . Smoking status: Never Smoker   . Smokeless tobacco: Not on file  . Alcohol Use: No    No Known Allergies  Current Outpatient Prescriptions  Medication Sig Dispense Refill  . ACCU-CHEK AVIVA PLUS test strip       . amLODipine (NORVASC) 10 MG tablet Take 10 mg by mouth daily.      . B  Complex-C (B-COMPLEX WITH VITAMIN C) tablet Take 1 tablet by mouth daily.      Marland Kitchen estrogens, conjugated, (PREMARIN) 0.3 MG tablet Take 0.3 mg by mouth daily. Take daily for 21 days then do not take for 7 days.      . insulin glargine (LANTUS) 100 UNIT/ML injection Inject 20 Units into the skin at bedtime.      . INVOKANA 100 MG TABS       . Lancets (ACCU-CHEK MULTICLIX) lancets       . metFORMIN (GLUCOPHAGE) 500 MG tablet       . metoprolol (LOPRESSOR) 100 MG tablet Take 100 mg by mouth 2 (two) times daily. 100mg  in the am and 50 mg qhs      . omeprazole (PRILOSEC) 20 MG capsule Take 20 mg by mouth daily.      . simvastatin (ZOCOR) 40 MG tablet Take 40 mg by mouth every morning.      . valsartan-hydrochlorothiazide (DIOVAN-HCT) 320-12.5 MG per tablet Take 1 tablet by mouth daily.      . vitamin C (ASCORBIC ACID) 500 MG tablet Take 500 mg by mouth daily. 2 by mouth twice a day      . vitamin E 1000 UNIT capsule Take 1,000 Units by mouth daily.  Review of Systems Review of Systems  Constitutional: Negative for fever and chills.  Respiratory: Negative.   Cardiovascular: Negative.   Gastrointestinal: Positive for nausea and abdominal pain.  Genitourinary: Negative.   Neurological: Negative.   Hematological: Negative.     Blood pressure 132/84, pulse 68, temperature 97.7 F (36.5 C), temperature source Temporal, height 5\' 2"  (1.575 m), weight 144 lb (65.318 kg), SpO2 98.00%.  Physical Exam Physical Exam  Constitutional: She appears well-developed and well-nourished. No distress.  HENT:  Head: Normocephalic and atraumatic.  Eyes: EOM are normal. No scleral icterus.       Wears glasses.  Neck: Neck supple.  Cardiovascular: Normal rate and regular rhythm.   Pulmonary/Chest: Effort normal and breath sounds normal.  Abdominal: Soft. There is tenderness (mild in RUQ).       Right upper paramedian scar.  Lower midline scar.  Musculoskeletal: She exhibits no edema.  Skin: Skin is  warm and dry.    Data Reviewed Dr. Scharlene Dodson notes.  Assessment    Gallbladder/biliary dyskinesia-symptomatic    Plan    Laparoscopic possible open cholecystectomy.  I have explained the procedure, risks, and aftercare of cholecystectomy.  Risks include but are not limited to bleeding, infection, wound problems, anesthesia, diarrhea, bile leak, injury to common bile duct/liver/intestine.  She seems to understand and agrees to proceed.        [name] J 07/29/2012, 9:51 AM

## 2012-08-27 ENCOUNTER — Telehealth (INDEPENDENT_AMBULATORY_CARE_PROVIDER_SITE_OTHER): Payer: Self-pay | Admitting: General Surgery

## 2012-08-27 NOTE — Telephone Encounter (Signed)
Pt called to state she wants to postpone surgery until after the first of the year.  She understands to call back and schedule appt with Dr. Abbey Chatters for a new evaluation.  Discussion of foods to avoid to help prevent GB attack.  Understands to go to ER for severe pain.

## 2012-12-04 LAB — HM DIABETES EYE EXAM: HM Diabetic Eye Exam: NORMAL

## 2013-02-05 ENCOUNTER — Telehealth: Payer: Self-pay | Admitting: Internal Medicine

## 2013-02-05 NOTE — Telephone Encounter (Signed)
Dr. Yetta Barre has agreed to accept this patient and her husband as new patients, called and left message with Husband for them to call back and schedule

## 2013-03-27 ENCOUNTER — Encounter: Payer: Self-pay | Admitting: Internal Medicine

## 2013-03-27 ENCOUNTER — Ambulatory Visit (INDEPENDENT_AMBULATORY_CARE_PROVIDER_SITE_OTHER): Payer: Medicare Other | Admitting: Internal Medicine

## 2013-03-27 VITALS — BP 108/66 | HR 59 | Temp 97.3°F | Resp 16 | Ht 62.0 in | Wt 137.0 lb

## 2013-03-27 DIAGNOSIS — Z1231 Encounter for screening mammogram for malignant neoplasm of breast: Secondary | ICD-10-CM

## 2013-03-27 DIAGNOSIS — E1165 Type 2 diabetes mellitus with hyperglycemia: Secondary | ICD-10-CM | POA: Insufficient documentation

## 2013-03-27 DIAGNOSIS — IMO0002 Reserved for concepts with insufficient information to code with codable children: Secondary | ICD-10-CM | POA: Insufficient documentation

## 2013-03-27 DIAGNOSIS — E785 Hyperlipidemia, unspecified: Secondary | ICD-10-CM

## 2013-03-27 DIAGNOSIS — Z0000 Encounter for general adult medical examination without abnormal findings: Secondary | ICD-10-CM

## 2013-03-27 DIAGNOSIS — D510 Vitamin B12 deficiency anemia due to intrinsic factor deficiency: Secondary | ICD-10-CM | POA: Insufficient documentation

## 2013-03-27 DIAGNOSIS — IMO0001 Reserved for inherently not codable concepts without codable children: Secondary | ICD-10-CM

## 2013-03-27 NOTE — Progress Notes (Signed)
Subjective:    Patient ID: [name], female    DOB: [birth year], 67 y.o.   MRN: [id number]  HPI Comments: New to me, she has been seeing Drs. Nida and Befekada - no prior records available today.   Diabetes She presents for her follow-up diabetic visit. She has type 2 diabetes mellitus. There are no hypoglycemic associated symptoms. Pertinent negatives for hypoglycemia include no dizziness or pallor. There are no diabetic associated symptoms. Pertinent negatives for diabetes include no blurred vision, no chest pain, no fatigue, no foot paresthesias, no foot ulcerations, no polydipsia, no polyphagia, no polyuria, no visual change, no weakness and no weight loss. There are no hypoglycemic complications. Symptoms are stable. There are no diabetic complications. Current diabetic treatment includes insulin injections and oral agent (monotherapy). She is compliant with treatment all of the time. Her weight is stable. She is following a generally healthy diet. Meal planning includes avoidance of concentrated sweets. She has not had a previous visit with a dietician. She participates in exercise intermittently. There is no change in her home blood glucose trend. An ACE inhibitor/angiotensin II receptor blocker is being taken. She does not see a podiatrist.Eye exam is current.      Review of Systems  Constitutional: Negative.  Negative for fever, chills, weight loss, diaphoresis, activity change, appetite change, fatigue and unexpected weight change.  HENT: Negative.   Eyes: Negative.  Negative for blurred vision.  Respiratory: Negative.  Negative for cough, chest tightness, shortness of breath, wheezing and stridor.   Cardiovascular: Negative.  Negative for chest pain, palpitations and leg swelling.  Gastrointestinal: Negative.  Negative for nausea, vomiting, abdominal pain, diarrhea and constipation.  Endocrine: Negative.  Negative for polydipsia, polyphagia and polyuria.  Genitourinary: Negative.    Musculoskeletal: Negative.  Negative for myalgias, back pain, joint swelling and gait problem.  Skin: Negative.  Negative for color change, pallor and rash.  Allergic/Immunologic: Negative.   Neurological: Negative.  Negative for dizziness, syncope and weakness.  Hematological: Negative.  Negative for adenopathy. Does not bruise/bleed easily.  Psychiatric/Behavioral: Negative.        Objective:   Physical Exam  Vitals reviewed. Constitutional: She is oriented to person, place, and time. She appears well-developed and well-nourished. No distress.  HENT:  Head: Normocephalic and atraumatic.  Mouth/Throat: Oropharynx is clear and moist. No oropharyngeal exudate.  Eyes: Conjunctivae are normal. Right eye exhibits no discharge. Left eye exhibits no discharge. No scleral icterus.  Neck: Normal range of motion. Neck supple. No JVD present. No tracheal deviation present. No thyromegaly present.  Cardiovascular: Normal rate, regular rhythm, normal heart sounds and intact distal pulses.  Exam reveals no gallop and no friction rub.   No murmur heard. Pulses:      Carotid pulses are 1+ on the right side, and 1+ on the left side.      Radial pulses are 1+ on the right side, and 1+ on the left side.       Femoral pulses are 1+ on the right side, and 1+ on the left side.      Popliteal pulses are 1+ on the right side, and 1+ on the left side.       Dorsalis pedis pulses are 1+ on the right side, and 1+ on the left side.       Posterior tibial pulses are 1+ on the right side, and 1+ on the left side.  Pulmonary/Chest: Effort normal and breath sounds normal. No stridor. No respiratory distress. She has  no wheezes. She has no rales. She exhibits no tenderness.  Abdominal: Soft. Bowel sounds are normal. She exhibits no distension and no mass. There is no tenderness. There is no rebound and no guarding.  Musculoskeletal: Normal range of motion. She exhibits no edema and no tenderness.  Lymphadenopathy:     She has no cervical adenopathy.  Neurological: She is oriented to person, place, and time.  Skin: Skin is warm and dry. No rash noted. She is not diaphoretic. No erythema. No pallor.  Psychiatric: She has a normal mood and affect. Her behavior is normal. Judgment and thought content normal.     Lab Results  Component Value Date   WBC 7.8 06/12/2011   HGB 10.1* 06/20/2011   HCT 30.8* 06/20/2011   PLT 366 06/12/2011   GLUCOSE 132* 06/20/2011   NA 135 06/20/2011   K 4.2 06/20/2011   CL 99 06/20/2011   CREATININE 0.57 06/20/2011   BUN 10 06/20/2011   CO2 27 06/20/2011   INR 1.02 06/12/2011       Assessment & Plan:

## 2013-03-27 NOTE — Patient Instructions (Signed)
Health Maintenance, Females A healthy lifestyle and preventative care can promote health and wellness.  Maintain regular health, dental, and eye exams.  Eat a healthy diet. Foods like vegetables, fruits, whole grains, low-fat dairy products, and lean protein foods contain the nutrients you need without too many calories. Decrease your intake of foods high in solid fats, added sugars, and salt. Get information about a proper diet from your caregiver, if necessary.  Regular physical exercise is one of the most important things you can do for your health. Most adults should get at least 150 minutes of moderate-intensity exercise (any activity that increases your heart rate and causes you to sweat) each week. In addition, most adults need muscle-strengthening exercises on 2 or more days a week.   Maintain a healthy weight. The body mass index (BMI) is a screening tool to identify possible weight problems. It provides an estimate of body fat based on height and weight. Your caregiver can help determine your BMI, and can help you achieve or maintain a healthy weight. For adults 20 years and older:  A BMI below 18.5 is considered underweight.  A BMI of 18.5 to 24.9 is normal.  A BMI of 25 to 29.9 is considered overweight.  A BMI of 30 and above is considered obese.  Maintain normal blood lipids and cholesterol by exercising and minimizing your intake of saturated fat. Eat a balanced diet with plenty of fruits and vegetables. Blood tests for lipids and cholesterol should begin at age 20 and be repeated every 5 years. If your lipid or cholesterol levels are high, you are over 50, or you are a high risk for heart disease, you may need your cholesterol levels checked more frequently.Ongoing high lipid and cholesterol levels should be treated with medicines if diet and exercise are not effective.  If you smoke, find out from your caregiver how to quit. If you do not use tobacco, do not start.  If you  are pregnant, do not drink alcohol. If you are breastfeeding, be very cautious about drinking alcohol. If you are not pregnant and choose to drink alcohol, do not exceed 1 drink per day. One drink is considered to be 12 ounces (355 mL) of beer, 5 ounces (148 mL) of wine, or 1.5 ounces (44 mL) of liquor.  Avoid use of street drugs. Do not share needles with anyone. Ask for help if you need support or instructions about stopping the use of drugs.  High blood pressure causes heart disease and increases the risk of stroke. Blood pressure should be checked at least every 1 to 2 years. Ongoing high blood pressure should be treated with medicines, if weight loss and exercise are not effective.  If you are 55 to 69 years old, ask your caregiver if you should take aspirin to prevent strokes.  Diabetes screening involves taking a blood sample to check your fasting blood sugar level. This should be done once every 3 years, after age 45, if you are within normal weight and without risk factors for diabetes. Testing should be considered at a younger age or be carried out more frequently if you are overweight and have at least 1 risk factor for diabetes.  Breast cancer screening is essential preventative care for women. You should practice "breast self-awareness." This means understanding the normal appearance and feel of your breasts and may include breast self-examination. Any changes detected, no matter how small, should be reported to a caregiver. Women in their 20s and 30s should have   a clinical breast exam (CBE) by a caregiver as part of a regular health exam every 1 to 3 years. After age 40, women should have a CBE every year. Starting at age 40, women should consider having a mammogram (breast X-ray) every year. Women who have a family history of breast cancer should talk to their caregiver about genetic screening. Women at a high risk of breast cancer should talk to their caregiver about having an MRI and a  mammogram every year.  The Pap test is a screening test for cervical cancer. Women should have a Pap test starting at age 21. Between ages 21 and 29, Pap tests should be repeated every 2 years. Beginning at age 30, you should have a Pap test every 3 years as long as the past 3 Pap tests have been normal. If you had a hysterectomy for a problem that was not cancer or a condition that could lead to cancer, then you no longer need Pap tests. If you are between ages 65 and 70, and you have had normal Pap tests going back 10 years, you no longer need Pap tests. If you have had past treatment for cervical cancer or a condition that could lead to cancer, you need Pap tests and screening for cancer for at least 20 years after your treatment. If Pap tests have been discontinued, risk factors (such as a new sexual partner) need to be reassessed to determine if screening should be resumed. Some women have medical problems that increase the chance of getting cervical cancer. In these cases, your caregiver may recommend more frequent screening and Pap tests.  The human papillomavirus (HPV) test is an additional test that may be used for cervical cancer screening. The HPV test looks for the virus that can cause the cell changes on the cervix. The cells collected during the Pap test can be tested for HPV. The HPV test could be used to screen women aged 30 years and older, and should be used in women of any age who have unclear Pap test results. After the age of 30, women should have HPV testing at the same frequency as a Pap test.  Colorectal cancer can be detected and often prevented. Most routine colorectal cancer screening begins at the age of 50 and continues through age 75. However, your caregiver may recommend screening at an earlier age if you have risk factors for colon cancer. On a yearly basis, your caregiver may provide home test kits to check for hidden blood in the stool. Use of a small camera at the end of a  tube, to directly examine the colon (sigmoidoscopy or colonoscopy), can detect the earliest forms of colorectal cancer. Talk to your caregiver about this at age 50, when routine screening begins. Direct examination of the colon should be repeated every 5 to 10 years through age 75, unless early forms of pre-cancerous polyps or small growths are found.  Hepatitis C blood testing is recommended for all people born from 1945 through 1965 and any individual with known risks for hepatitis C.  Practice safe sex. Use condoms and avoid high-risk sexual practices to reduce the spread of sexually transmitted infections (STIs). Sexually active women aged 25 and younger should be checked for Chlamydia, which is a common sexually transmitted infection. Older women with new or multiple partners should also be tested for Chlamydia. Testing for other STIs is recommended if you are sexually active and at increased risk.  Osteoporosis is a disease in which the   bones lose minerals and strength with aging. This can result in serious bone fractures. The risk of osteoporosis can be identified using a bone density scan. Women ages 13 and over and women at risk for fractures or osteoporosis should discuss screening with their caregivers. Ask your caregiver whether you should be taking a calcium supplement or vitamin D to reduce the rate of osteoporosis.  Menopause can be associated with physical symptoms and risks. Hormone replacement therapy is available to decrease symptoms and risks. You should talk to your caregiver about whether hormone replacement therapy is right for you.  Use sunscreen with a sun protection factor (SPF) of 30 or greater. Apply sunscreen liberally and repeatedly throughout the day. You should seek shade when your shadow is shorter than you. Protect yourself by wearing long sleeves, pants, a wide-brimmed hat, and sunglasses year round, whenever you are outdoors.  Notify your caregiver of new moles or  changes in moles, especially if there is a change in shape or color. Also notify your caregiver if a mole is larger than the size of a pencil eraser.  Stay current with your immunizations. Document Released: 06/12/2011 Document Revised: 02/19/2012 Document Reviewed: 06/12/2011 [facility] Patient Information 2013 [place], [place]. Diabetes, Type 2 Diabetes is a long-lasting (chronic) disease. In type 2 diabetes, the pancreas does not make enough insulin (a hormone), and the body does not respond normally to the insulin that is made. This type of diabetes was also previously called adult-onset diabetes. It usually occurs after the age of 81, but it can occur at any age.  CAUSES  Type 2 diabetes happens because the pancreasis not making enough insulin or your body has trouble using the insulin that your pancreas does make properly. SYMPTOMS   Drinking more than usual.  Urinating more than usual.  Blurred vision.  Dry, itchy skin.  Frequent infections.  Feeling more tired than usual (fatigue). DIAGNOSIS The diagnosis of type 2 diabetes is usually made by one of the following tests:  Fasting blood glucose test. You will not eat for at least 8 hours and then take a blood test.  Random blood glucose test. Your blood glucose (sugar) is checked at any time of the day regardless of when you ate.  Oral glucose tolerance test (OGTT). Your blood glucose is measured after you have not eaten (fasted) and then after you drink a glucose containing beverage. TREATMENT   Healthy eating.  Exercise.  Medicine, if needed.  Monitoring blood glucose.  Seeing your caregiver regularly. HOME CARE INSTRUCTIONS   Check your blood glucose at least once a day. More frequent monitoring may be necessary, depending on your medicines and on how well your diabetes is controlled. Your caregiver will advise you.  Take your medicine as directed by your caregiver.  Do not smoke.  Make wise food choices. Ask  your caregiver for information. Weight loss can improve your diabetes.  Learn about low blood glucose (hypoglycemia) and how to treat it.  Get your eyes checked regularly.  Have a yearly physical exam. Have your blood pressure checked and your blood and urine tested.  Wear a pendant or bracelet saying that you have diabetes.  Check your feet every night for cuts, sores, blisters, and redness. Let your caregiver know if you have any problems. SEEK MEDICAL CARE IF:   You have problems keeping your blood glucose in target range.  You have problems with your medicines.  You have symptoms of an illness that do not improve after 24  hours.  You have a sore or wound that is not healing.  You notice a change in vision or a new problem with your vision.  You have a fever. MAKE SURE YOU:  Understand these instructions.  Will watch your condition.  Will get help right away if you are not doing well or get worse. Document Released: 11/27/2005 Document Revised: 02/19/2012 Document Reviewed: 05/15/2011 [facility] Patient Information 2013 [place], [place].

## 2013-03-28 ENCOUNTER — Encounter: Payer: Self-pay | Admitting: Internal Medicine

## 2013-03-28 DIAGNOSIS — Z0000 Encounter for general adult medical examination without abnormal findings: Secondary | ICD-10-CM | POA: Insufficient documentation

## 2013-03-28 NOTE — Assessment & Plan Note (Signed)
I will check her a1c today and will address if needed I will monitor her renal function as well

## 2013-03-28 NOTE — Assessment & Plan Note (Signed)
She has no s/s of blood loss I will recheck her CBC and will look at her vitamin levels as well 

## 2013-03-28 NOTE — Assessment & Plan Note (Addendum)

## 2013-03-28 NOTE — Assessment & Plan Note (Signed)
She is doing well on simvastatin FLP TSH CMP today

## 2013-04-10 ENCOUNTER — Ambulatory Visit (HOSPITAL_COMMUNITY)
Admission: RE | Admit: 2013-04-10 | Discharge: 2013-04-10 | Disposition: A | Discharge status: Home / Self Care | Payer: Medicare Other | Source: Ambulatory Visit | Attending: Internal Medicine | Admitting: Internal Medicine

## 2013-04-10 DIAGNOSIS — Z1231 Encounter for screening mammogram for malignant neoplasm of breast: Secondary | ICD-10-CM

## 2013-04-14 ENCOUNTER — Other Ambulatory Visit: Payer: Self-pay | Admitting: Internal Medicine

## 2013-04-14 DIAGNOSIS — R928 Other abnormal and inconclusive findings on diagnostic imaging of breast: Secondary | ICD-10-CM

## 2013-04-15 ENCOUNTER — Ambulatory Visit
Admission: RE | Admit: 2013-04-15 | Discharge: 2013-04-15 | Disposition: A | Discharge status: Home / Self Care | Payer: Medicare Other | Source: Ambulatory Visit | Attending: Internal Medicine | Admitting: Internal Medicine

## 2013-04-15 ENCOUNTER — Telehealth: Payer: Self-pay

## 2013-04-15 DIAGNOSIS — R928 Other abnormal and inconclusive findings on diagnostic imaging of breast: Secondary | ICD-10-CM

## 2013-04-15 LAB — HM MAMMOGRAPHY: HM Mammogram: NORMAL

## 2013-04-15 NOTE — Telephone Encounter (Signed)
Patient called [facility] stating that she received a call regarding a scheduled appointment for and U/S of breast. Per pt she would like to know why this is needed and what were the results of her mammogram. Thanks

## 2013-04-15 NOTE — Telephone Encounter (Signed)
[facility] requesting pt call back. Advised that I am out of the office this afternoon but anyone can help.

## 2013-04-15 NOTE — Telephone Encounter (Signed)
Her mammogram was abnormal

## 2013-04-16 NOTE — Telephone Encounter (Signed)
No returned call from pt, U/S scheduled 04/15/13 and shows pt arrived per Epic. Closing phone note

## 2013-04-24 ENCOUNTER — Other Ambulatory Visit: Payer: Medicare Other

## 2013-07-25 ENCOUNTER — Ambulatory Visit (INDEPENDENT_AMBULATORY_CARE_PROVIDER_SITE_OTHER)
Admission: RE | Admit: 2013-07-25 | Discharge: 2013-07-25 | Disposition: A | Discharge status: Home / Self Care | Payer: Medicare Other | Source: Ambulatory Visit | Attending: Internal Medicine | Admitting: Internal Medicine

## 2013-07-25 ENCOUNTER — Encounter: Payer: Self-pay | Admitting: Internal Medicine

## 2013-07-25 ENCOUNTER — Ambulatory Visit (INDEPENDENT_AMBULATORY_CARE_PROVIDER_SITE_OTHER): Payer: Medicare Other | Admitting: Internal Medicine

## 2013-07-25 ENCOUNTER — Other Ambulatory Visit (INDEPENDENT_AMBULATORY_CARE_PROVIDER_SITE_OTHER): Payer: Medicare Other

## 2013-07-25 VITALS — BP 110/64 | HR 58 | Temp 98.0°F | Resp 16 | Wt 140.0 lb

## 2013-07-25 DIAGNOSIS — M25561 Pain in right knee: Secondary | ICD-10-CM

## 2013-07-25 DIAGNOSIS — M25569 Pain in unspecified knee: Secondary | ICD-10-CM

## 2013-07-25 DIAGNOSIS — Z23 Encounter for immunization: Secondary | ICD-10-CM

## 2013-07-25 DIAGNOSIS — E785 Hyperlipidemia, unspecified: Secondary | ICD-10-CM

## 2013-07-25 DIAGNOSIS — D510 Vitamin B12 deficiency anemia due to intrinsic factor deficiency: Secondary | ICD-10-CM

## 2013-07-25 DIAGNOSIS — M1710 Unilateral primary osteoarthritis, unspecified knee: Secondary | ICD-10-CM | POA: Insufficient documentation

## 2013-07-25 DIAGNOSIS — IMO0001 Reserved for inherently not codable concepts without codable children: Secondary | ICD-10-CM

## 2013-07-25 DIAGNOSIS — Z2911 Encounter for prophylactic immunotherapy for respiratory syncytial virus (RSV): Secondary | ICD-10-CM

## 2013-07-25 LAB — CBC WITH DIFFERENTIAL/PLATELET
Basophils Relative: 0.4 % (ref 0.0–3.0)
Eosinophils Absolute: 0.1 10*3/uL (ref 0.0–0.7)
Eosinophils Relative: 0.7 % (ref 0.0–5.0)
HCT: 36.3 % (ref 36.0–46.0)
Lymphs Abs: 3.4 10*3/uL (ref 0.7–4.0)
MCHC: 33.6 g/dL (ref 30.0–36.0)
MCV: 84.3 fl (ref 78.0–100.0)
Monocytes Absolute: 0.6 10*3/uL (ref 0.1–1.0)
Neutrophils Relative %: 58.5 % (ref 43.0–77.0)
RBC: 4.31 Mil/uL (ref 3.87–5.11)
WBC: 10 10*3/uL (ref 4.5–10.5)

## 2013-07-25 LAB — HEMOGLOBIN A1C: Hgb A1c MFr Bld: 7.7 % — ABNORMAL HIGH (ref 4.6–6.5)

## 2013-07-25 LAB — COMPREHENSIVE METABOLIC PANEL
AST: 17 U/L (ref 0–37)
Albumin: 3.9 g/dL (ref 3.5–5.2)
Alkaline Phosphatase: 59 U/L (ref 39–117)
BUN: 17 mg/dL (ref 6–23)
Glucose, Bld: 145 mg/dL — ABNORMAL HIGH (ref 70–99)
Potassium: 4.5 mEq/L (ref 3.5–5.1)
Sodium: 139 mEq/L (ref 135–145)
Total Bilirubin: 0.4 mg/dL (ref 0.3–1.2)
Total Protein: 7.5 g/dL (ref 6.0–8.3)

## 2013-07-25 LAB — FOLATE: Folate: 21.1 ng/mL (ref >5.9)

## 2013-07-25 LAB — IBC PANEL
Iron: 53 ug/dL (ref 42–145)
Saturation Ratios: 10.4 % — ABNORMAL LOW (ref 20.0–50.0)

## 2013-07-25 LAB — LIPID PANEL
Cholesterol: 179 mg/dL (ref 0–200)
HDL: 61.1 mg/dL (ref >39.00)
Triglycerides: 360 mg/dL — ABNORMAL HIGH (ref 0.0–149.0)

## 2013-07-25 LAB — URINALYSIS, ROUTINE W REFLEX MICROSCOPIC
Bilirubin Urine: NEGATIVE
Ketones, ur: NEGATIVE
Specific Gravity, Urine: 1.02 (ref 1.000–1.030)
Total Protein, Urine: NEGATIVE
pH: 6.5 (ref 5.0–8.0)

## 2013-07-25 LAB — FERRITIN: Ferritin: 8.6 ng/mL — ABNORMAL LOW (ref 10.0–291.0)

## 2013-07-25 LAB — LDL CHOLESTEROL, DIRECT: Direct LDL: 86.3 mg/dL

## 2013-07-25 MED ORDER — INSULIN GLARGINE 100 UNIT/ML ~~LOC~~ SOLN: 26.0000 [IU] | Freq: Every day | SUBCUTANEOUS | Status: DC

## 2013-07-25 NOTE — Patient Instructions (Signed)

## 2013-07-25 NOTE — Progress Notes (Signed)
Subjective:    Patient ID: [name], female    DOB: Aug 02, 1946, 67 y.o.   MRN: [id number]  Knee Pain  There was no injury mechanism. The pain is present in the right knee. The quality of the pain is described as aching. The pain is at a severity of 2/10. The pain is mild. The pain has been worsening since onset. Pertinent negatives include no inability to bear weight, loss of motion, loss of sensation, muscle weakness, numbness or tingling. She reports no foreign bodies present. The symptoms are aggravated by movement. She has tried NSAIDs for the symptoms. The treatment provided mild relief.      Review of Systems  Constitutional: Negative.  Negative for fever, chills, diaphoresis, activity change, appetite change, fatigue and unexpected weight change.  HENT: Negative.   Eyes: Negative.   Respiratory: Negative.  Negative for apnea, cough, choking, shortness of breath, wheezing and stridor.   Cardiovascular: Negative.  Negative for chest pain, palpitations and leg swelling.  Gastrointestinal: Negative.  Negative for nausea, vomiting, abdominal pain, diarrhea and constipation.  Endocrine: Negative.   Genitourinary: Negative.   Musculoskeletal: Negative.  Negative for myalgias, back pain, joint swelling and gait problem.  Skin: Negative.   Allergic/Immunologic: Negative.   Neurological: Negative for dizziness, tingling, weakness, light-headedness and numbness.  Hematological: Negative.  Negative for adenopathy. Does not bruise/bleed easily.  Psychiatric/Behavioral: Negative.        Objective:   Physical Exam  Vitals reviewed. Constitutional: She is oriented to person, place, and time. She appears well-developed and well-nourished. No distress.  HENT:  Head: Normocephalic and atraumatic.  Mouth/Throat: No oropharyngeal exudate.  Eyes: Conjunctivae are normal. Right eye exhibits no discharge. Left eye exhibits no discharge. No scleral icterus.  Neck: Normal range of motion. Neck  supple. No JVD present. No tracheal deviation present. No thyromegaly present.  Cardiovascular: Normal rate, regular rhythm, normal heart sounds and intact distal pulses.  Exam reveals no gallop and no friction rub.   No murmur heard. Pulmonary/Chest: Effort normal and breath sounds normal. No stridor. No respiratory distress. She has no wheezes. She has no rales. She exhibits no tenderness.  Abdominal: Soft. Bowel sounds are normal. She exhibits no distension and no mass. There is no tenderness. There is no rebound and no guarding.  Musculoskeletal: Normal range of motion. She exhibits no edema and no tenderness.       Right knee: Normal. She exhibits normal range of motion, no swelling, no effusion, no ecchymosis, no deformity, no laceration, no erythema, normal alignment, no LCL laxity, normal patellar mobility, no bony tenderness and normal meniscus. No tenderness found. No medial joint line and no lateral joint line tenderness noted.  Lymphadenopathy:    She has no cervical adenopathy.  Neurological: She is oriented to person, place, and time.  Skin: Skin is warm and dry. No rash noted. She is not diaphoretic. No erythema. No pallor.  Psychiatric: She has a normal mood and affect. Her behavior is normal. Judgment and thought content normal.     Lab Results  Component Value Date   WBC 7.8 06/12/2011   HGB 10.1* 06/20/2011   HCT 30.8* 06/20/2011   PLT 366 06/12/2011   GLUCOSE 132* 06/20/2011   NA 135 06/20/2011   K 4.2 06/20/2011   CL 99 06/20/2011   CREATININE 0.57 06/20/2011   BUN 10 06/20/2011   CO2 27 06/20/2011   INR 1.02 06/12/2011        Assessment & Plan:

## 2013-07-26 ENCOUNTER — Encounter: Payer: Self-pay | Admitting: Internal Medicine

## 2013-07-26 MED ORDER — DICLOFENAC SODIUM 2 % TD SOLN: 2.0000 | Freq: Two times a day (BID) | TRANSDERMAL | Status: DC

## 2013-07-26 NOTE — Assessment & Plan Note (Signed)
She will try pennsaid for the pain

## 2013-07-26 NOTE — Assessment & Plan Note (Signed)
CBC and Vitamin levels are normal today

## 2013-07-26 NOTE — Assessment & Plan Note (Signed)
Her A1C is a little too high She will d/w her ENDO

## 2013-07-26 NOTE — Assessment & Plan Note (Signed)
Goal achieved 

## 2013-09-18 ENCOUNTER — Other Ambulatory Visit: Payer: Self-pay | Admitting: Internal Medicine

## 2013-09-18 DIAGNOSIS — N63 Unspecified lump in unspecified breast: Secondary | ICD-10-CM

## 2013-10-16 ENCOUNTER — Other Ambulatory Visit: Payer: Self-pay

## 2013-10-17 ENCOUNTER — Ambulatory Visit
Admission: RE | Admit: 2013-10-17 | Discharge: 2013-10-17 | Disposition: A | Discharge status: Home / Self Care | Payer: Medicare Other | Source: Ambulatory Visit | Attending: Internal Medicine | Admitting: Internal Medicine

## 2013-10-17 DIAGNOSIS — N63 Unspecified lump in unspecified breast: Secondary | ICD-10-CM

## 2013-10-17 LAB — HM MAMMOGRAPHY: HM Mammogram: NORMAL

## 2014-02-03 ENCOUNTER — Encounter: Payer: Self-pay | Admitting: Internal Medicine

## 2014-03-19 ENCOUNTER — Other Ambulatory Visit: Payer: Self-pay

## 2014-03-31 ENCOUNTER — Other Ambulatory Visit: Payer: Self-pay

## 2014-03-31 DIAGNOSIS — Z1231 Encounter for screening mammogram for malignant neoplasm of breast: Secondary | ICD-10-CM

## 2014-06-25 ENCOUNTER — Other Ambulatory Visit: Payer: Self-pay

## 2014-06-26 ENCOUNTER — Ambulatory Visit
Admission: RE | Admit: 2014-06-26 | Discharge: 2014-06-26 | Disposition: A | Discharge status: Home / Self Care | Payer: 59 | Source: Ambulatory Visit

## 2014-06-26 ENCOUNTER — Other Ambulatory Visit: Payer: Self-pay | Admitting: Internal Medicine

## 2014-06-26 ENCOUNTER — Other Ambulatory Visit: Payer: Self-pay

## 2014-06-26 ENCOUNTER — Ambulatory Visit
Admission: RE | Admit: 2014-06-26 | Discharge: 2014-06-26 | Disposition: A | Discharge status: Home / Self Care | Payer: 59 | Source: Ambulatory Visit | Attending: Internal Medicine | Admitting: Internal Medicine

## 2014-06-26 DIAGNOSIS — M8580 Other specified disorders of bone density and structure, unspecified site: Secondary | ICD-10-CM

## 2014-06-26 DIAGNOSIS — Z1231 Encounter for screening mammogram for malignant neoplasm of breast: Secondary | ICD-10-CM

## 2014-08-11 ENCOUNTER — Encounter: Payer: Self-pay | Admitting: Internal Medicine

## 2014-09-21 ENCOUNTER — Telehealth: Payer: Self-pay

## 2014-09-21 NOTE — Telephone Encounter (Signed)
Pt states that she has had an eye exam this year at [facility] in [place].  Called [place] office to have a copy of report faxed to [phone].

## 2014-10-22 ENCOUNTER — Ambulatory Visit (INDEPENDENT_AMBULATORY_CARE_PROVIDER_SITE_OTHER): Payer: 59 | Admitting: Internal Medicine

## 2014-10-22 ENCOUNTER — Encounter: Payer: Self-pay | Admitting: Internal Medicine

## 2014-10-22 VITALS — BP 110/62 | HR 63 | Temp 98.1°F | Resp 16 | Ht 62.0 in | Wt 138.0 lb

## 2014-10-22 DIAGNOSIS — E119 Type 2 diabetes mellitus without complications: Secondary | ICD-10-CM

## 2014-10-22 DIAGNOSIS — Z0000 Encounter for general adult medical examination without abnormal findings: Secondary | ICD-10-CM

## 2014-10-22 MED ORDER — BENZONATATE 100 MG PO CAPS: 100.0000 mg | ORAL_CAPSULE | Freq: Three times a day (TID) | ORAL | Status: DC | PRN

## 2014-10-22 MED ORDER — GUAIFENESIN-CODEINE 100-10 MG/5ML PO SOLN: 5.0000 mL | Freq: Three times a day (TID) | ORAL | Status: DC | PRN

## 2014-10-22 NOTE — Progress Notes (Signed)
Pre visit review using our clinic review tool, if applicable. No additional management support is needed unless otherwise documented below in the visit note. 

## 2014-10-22 NOTE — Patient Instructions (Signed)
We have given you a medicine for cough. You can take 5 mL up to 3 times a day for cough. It may make you a little drowsy so I would try it before you take it at work.  We will get your records from your endocrinologist and will not need to take any blood work today.  Come back in about a year if you're doing well and healthy. If you have any problems or questions please call our office and we can see you sooner.  Health Maintenance Adopting a healthy lifestyle and getting preventive care can go a long way to promote health and wellness. Talk with your health care provider about what schedule of regular examinations is right for you. This is a good chance for you to check in with your provider about disease prevention and staying healthy. In between checkups, there are plenty of things you can do on your own. Experts have done a lot of research about which lifestyle changes and preventive measures are most likely to keep you healthy. Ask your health care provider for more information. WEIGHT AND DIET  Eat a healthy diet  Be sure to include plenty of vegetables, fruits, low-fat dairy products, and lean protein.  Do not eat a lot of foods high in solid fats, added sugars, or salt.  Get regular exercise. This is one of the most important things you can do for your health.  Most adults should exercise for at least 150 minutes each week. The exercise should increase your heart rate and make you sweat (moderate-intensity exercise).  Most adults should also do strengthening exercises at least twice a week. This is in addition to the moderate-intensity exercise.  Maintain a healthy weight  Body mass index (BMI) is a measurement that can be used to identify possible weight problems. It estimates body fat based on height and weight. Your health care provider can help determine your BMI and help you achieve or maintain a healthy weight.  For females 35 years of age and older:   A BMI below 18.5 is  considered underweight.  A BMI of 18.5 to 24.9 is normal.  A BMI of 25 to 29.9 is considered overweight.  A BMI of 30 and above is considered obese.  Watch levels of cholesterol and blood lipids  You should start having your blood tested for lipids and cholesterol at 70 years of age, then have this test every 5 years.  You may need to have your cholesterol levels checked more often if:  Your lipid or cholesterol levels are high.  You are older than 70 years of age.  You are at high risk for heart disease.  CANCER SCREENING   Lung Cancer  Lung cancer screening is recommended for adults 41-79 years old who are at high risk for lung cancer because of a history of smoking.  A yearly low-dose CT scan of the lungs is recommended for people who:  Currently smoke.  Have quit within the past 15 years.  Have at least a 30-pack-year history of smoking. A pack year is smoking an average of one pack of cigarettes a day for 1 year.  Yearly screening should continue until it has been 15 years since you quit.  Yearly screening should stop if you develop a health problem that would prevent you from having lung cancer treatment.  Breast Cancer  Practice breast self-awareness. This means understanding how your breasts normally appear and feel.  It also means doing regular breast self-exams.  Let your health care provider know about any changes, no matter how small.  If you are in your 20s or 30s, you should have a clinical breast exam (CBE) by a health care provider every 1-3 years as part of a regular health exam.  If you are 47 or older, have a CBE every year. Also consider having a breast X-ray (mammogram) every year.  If you have a family history of breast cancer, talk to your health care provider about genetic screening.  If you are at high risk for breast cancer, talk to your health care provider about having an MRI and a mammogram every year.  Breast cancer gene (BRCA)  assessment is recommended for women who have family members with BRCA-related cancers. BRCA-related cancers include:  Breast.  Ovarian.  Tubal.  Peritoneal cancers.  Results of the assessment will determine the need for genetic counseling and BRCA1 and BRCA2 testing. Cervical Cancer Routine pelvic examinations to screen for cervical cancer are no longer recommended for nonpregnant women who are considered low risk for cancer of the pelvic organs (ovaries, uterus, and vagina) and who do not have symptoms. A pelvic examination may be necessary if you have symptoms including those associated with pelvic infections. Ask your health care provider if a screening pelvic exam is right for you.   The Pap test is the screening test for cervical cancer for women who are considered at risk.  If you had a hysterectomy for a problem that was not cancer or a condition that could lead to cancer, then you no longer need Pap tests.  If you are older than 65 years, and you have had normal Pap tests for the past 10 years, you no longer need to have Pap tests.  If you have had past treatment for cervical cancer or a condition that could lead to cancer, you need Pap tests and screening for cancer for at least 20 years after your treatment.  If you no longer get a Pap test, assess your risk factors if they change (such as having a new sexual partner). This can affect whether you should start being screened again.  Some women have medical problems that increase their chance of getting cervical cancer. If this is the case for you, your health care provider may recommend more frequent screening and Pap tests.  The human papillomavirus (HPV) test is another test that may be used for cervical cancer screening. The HPV test looks for the virus that can cause cell changes in the cervix. The cells collected during the Pap test can be tested for HPV.  The HPV test can be used to screen women 26 years of age and older.  Getting tested for HPV can extend the interval between normal Pap tests from three to five years.  An HPV test also should be used to screen women of any age who have unclear Pap test results.  After 70 years of age, women should have HPV testing as often as Pap tests.  Colorectal Cancer  This type of cancer can be detected and often prevented.  Routine colorectal cancer screening usually begins at 70 years of age and continues through 70 years of age.  Your health care provider may recommend screening at an earlier age if you have risk factors for colon cancer.  Your health care provider may also recommend using home test kits to check for hidden blood in the stool.  A small camera at the end of a tube can be used  to examine your colon directly (sigmoidoscopy or colonoscopy). This is done to check for the earliest forms of colorectal cancer.  Routine screening usually begins at age 27.  Direct examination of the colon should be repeated every 5-10 years through 70 years of age. However, you may need to be screened more often if early forms of precancerous polyps or small growths are found. Skin Cancer  Check your skin from head to toe regularly.  Tell your health care provider about any new moles or changes in moles, especially if there is a change in a mole's shape or color.  Also tell your health care provider if you have a mole that is larger than the size of a pencil eraser.  Always use sunscreen. Apply sunscreen liberally and repeatedly throughout the day.  Protect yourself by wearing long sleeves, pants, a wide-brimmed hat, and sunglasses whenever you are outside. HEART DISEASE, DIABETES, AND HIGH BLOOD PRESSURE   Have your blood pressure checked at least every 1-2 years. High blood pressure causes heart disease and increases the risk of stroke.  If you are between 43 years and 66 years old, ask your health care provider if you should take aspirin to prevent  strokes.  Have regular diabetes screenings. This involves taking a blood sample to check your fasting blood sugar level.  If you are at a normal weight and have a low risk for diabetes, have this test once every three years after 70 years of age.  If you are overweight and have a high risk for diabetes, consider being tested at a younger age or more often. PREVENTING INFECTION  Hepatitis B  If you have a higher risk for hepatitis B, you should be screened for this virus. You are considered at high risk for hepatitis B if:  You were born in a country where hepatitis B is common. Ask your health care provider which countries are considered high risk.  Your parents were born in a high-risk country, and you have not been immunized against hepatitis B (hepatitis B vaccine).  You have HIV or AIDS.  You use needles to inject street drugs.  You live with someone who has hepatitis B.  You have had sex with someone who has hepatitis B.  You get hemodialysis treatment.  You take certain medicines for conditions, including cancer, organ transplantation, and autoimmune conditions. Hepatitis C  Blood testing is recommended for:  Everyone born from [birth year] through 1967.  Anyone with known risk factors for hepatitis C. Sexually transmitted infections (STIs)  You should be screened for sexually transmitted infections (STIs) including gonorrhea and chlamydia if:  You are sexually active and are younger than 70 years of age.  You are older than 70 years of age and your health care provider tells you that you are at risk for this type of infection.  Your sexual activity has changed since you were last screened and you are at an increased risk for chlamydia or gonorrhea. Ask your health care provider if you are at risk.  If you do not have HIV, but are at risk, it may be recommended that you take a prescription medicine daily to prevent HIV infection. This is called pre-exposure prophylaxis  (PrEP). You are considered at risk if:  You are sexually active and do not regularly use condoms or know the HIV status of your partner(s).  You take drugs by injection.  You are sexually active with a partner who has HIV. Talk with your health care provider  about whether you are at high risk of being infected with HIV. If you choose to begin PrEP, you should first be tested for HIV. You should then be tested every 3 months for as long as you are taking PrEP.  PREGNANCY   If you are premenopausal and you may become pregnant, ask your health care provider about preconception counseling.  If you may become pregnant, take 400 to 800 micrograms (mcg) of folic acid every day.  If you want to prevent pregnancy, talk to your health care provider about birth control (contraception). OSTEOPOROSIS AND MENOPAUSE   Osteoporosis is a disease in which the bones lose minerals and strength with aging. This can result in serious bone fractures. Your risk for osteoporosis can be identified using a bone density scan.  If you are 5 years of age or older, or if you are at risk for osteoporosis and fractures, ask your health care provider if you should be screened.  Ask your health care provider whether you should take a calcium or vitamin D supplement to lower your risk for osteoporosis.  Menopause may have certain physical symptoms and risks.  Hormone replacement therapy may reduce some of these symptoms and risks. Talk to your health care provider about whether hormone replacement therapy is right for you.  HOME CARE INSTRUCTIONS   Schedule regular health, dental, and eye exams.  Stay current with your immunizations.   Do not use any tobacco products including cigarettes, chewing tobacco, or electronic cigarettes.  If you are pregnant, do not drink alcohol.  If you are breastfeeding, limit how much and how often you drink alcohol.  Limit alcohol intake to no more than 1 drink per day for  nonpregnant women. One drink equals 12 ounces of beer, 5 ounces of wine, or 1 ounces of hard liquor.  Do not use street drugs.  Do not share needles.  Ask your health care provider for help if you need support or information about quitting drugs.  Tell your health care provider if you often feel depressed.  Tell your health care provider if you have ever been abused or do not feel safe at home. Document Released: 06/12/2011 Document Revised: 04/13/2014 Document Reviewed: 10/29/2013 [facility] Patient Information 2015 [place], [place]. This information is not intended to replace advice given to you by your health care provider. Make sure you discuss any questions you have with your health care provider.

## 2014-10-23 NOTE — Assessment & Plan Note (Addendum)
No complications. Foot exam done at today's visit. She continues to follow with endocrinology. We'll try to get records from them regarding last hemoglobin A1c. She has been to the eye doctor within the last year and will try to get those records as well.

## 2014-10-23 NOTE — Assessment & Plan Note (Signed)
Perform foot exam, she states that she has been to the eye doctor within the last year. She gets her hgA1c is done and her endocrinologist office, will fax to get those records. Last mammogram July 2015. Due for repeat colonoscopy in 2019.tetanus shot up-to-date. Finished shingles and pneumonia series.

## 2014-10-23 NOTE — Progress Notes (Signed)
   Subjective:    Patient ID: [name], female    DOB: [birth year], 67 y.o.   MRN: [id number]  HPI The patient is here to establish care as well as annual wellness exam. She has no new complaints. She has past medical history of DJD, GERD, IBS, type 2 diabetes. She does see endocrinology which helps to manage her type 2 diabetes. She denies any chest pain, shortness of breath. Denies hypo-or hyperglycemia. She does occasionally cheat on her diet but feels that she is doing well overall.  Diet: DM as diabetic Physical activity: sedentary Depression/mood screen: negative Hearing: intact to whispered voice Visual acuity: grossly normal, performs annual eye exam  ADLs: capable Fall risk: none Home safety: good Cognitive evaluation: intact to orientation, naming, recall and repetition EOL planning: adv directives, full code/ I agree  I have personally reviewed and have noted 1. The patient's medical and social history 2. Their use of alcohol, tobacco or illicit drugs 3. Their current medications and supplements 4. The patient's functional ability including ADL's, fall risks, home safety risks and hearing or visual impairment. 5. Diet and physical activities 6. Evidence for depression or mood disorders 7.  The patient's list of medical providers was updated.  Review of Systems  Constitutional: Negative for fever, activity change, appetite change, fatigue and unexpected weight change.  HENT: Negative.   Eyes: Negative.   Respiratory: Negative for cough, chest tightness, shortness of breath and wheezing.   Cardiovascular: Negative for chest pain, palpitations and leg swelling.  Gastrointestinal: Negative for abdominal pain, diarrhea, constipation and abdominal distention.  Musculoskeletal: Negative.   Skin: Negative.   Neurological: Negative.   Psychiatric/Behavioral: Negative.       Objective:   Physical Exam  Constitutional: She is oriented to person, place, and time. She  appears well-developed and well-nourished. No distress.  HENT:  Head: Normocephalic and atraumatic.  Eyes: EOM are normal.  Neck: Normal range of motion.  Cardiovascular: Normal rate and regular rhythm.   Pulmonary/Chest: Effort normal and breath sounds normal. No respiratory distress. She has no wheezes. She has no rales.  Abdominal: Soft. Bowel sounds are normal.  Neurological: She is alert and oriented to person, place, and time. Coordination normal.  Skin: Skin is warm and dry.   Filed Vitals:   10/22/14 1534  BP: 110/62  Pulse: 63  Temp: 98.1 F (36.7 C)  TempSrc: Oral  Resp: 16  Height: 5\' 2"  (1.575 m)  Weight: 138 lb (62.596 kg)  SpO2: 95%      Assessment & Plan:

## 2014-12-25 ENCOUNTER — Encounter: Payer: Self-pay | Admitting: Internal Medicine

## 2014-12-25 ENCOUNTER — Ambulatory Visit (INDEPENDENT_AMBULATORY_CARE_PROVIDER_SITE_OTHER): Payer: Medicare Other | Admitting: Internal Medicine

## 2014-12-25 VITALS — BP 106/64 | HR 64 | Temp 97.9°F | Resp 12 | Ht 62.0 in | Wt 135.2 lb

## 2014-12-25 DIAGNOSIS — N951 Menopausal and female climacteric states: Secondary | ICD-10-CM

## 2014-12-25 DIAGNOSIS — I8390 Asymptomatic varicose veins of unspecified lower extremity: Secondary | ICD-10-CM

## 2014-12-25 DIAGNOSIS — I868 Varicose veins of other specified sites: Secondary | ICD-10-CM

## 2014-12-25 DIAGNOSIS — R1012 Left upper quadrant pain: Secondary | ICD-10-CM

## 2014-12-25 DIAGNOSIS — R232 Flushing: Secondary | ICD-10-CM

## 2014-12-25 MED ORDER — ESTROGENS CONJUGATED 0.3 MG PO TABS: 0.3000 mg | ORAL_TABLET | Freq: Every day | ORAL | Status: DC

## 2014-12-25 NOTE — Patient Instructions (Signed)
We have sent in the premarin and if you need it (if the hot flashes get worse) it will be at the pharmacy.   We will check an ultrasound of the stomach to make sure that the side looks normal.

## 2014-12-25 NOTE — Progress Notes (Signed)
Pre visit review using our clinic review tool, if applicable. No additional management support is needed unless otherwise documented below in the visit note. 

## 2014-12-28 DIAGNOSIS — I8390 Asymptomatic varicose veins of unspecified lower extremity: Secondary | ICD-10-CM | POA: Insufficient documentation

## 2014-12-28 DIAGNOSIS — R232 Flushing: Secondary | ICD-10-CM | POA: Insufficient documentation

## 2014-12-28 NOTE — Assessment & Plan Note (Signed)
Some tenderness and throbbing, advised her to continue following with the vascular doctor.

## 2014-12-28 NOTE — Progress Notes (Signed)
   Subjective:    Patient ID: [name], female    DOB: 03-22-1946, 69 y.o.   MRN: [id number]  HPI The patient is a 69 YO woman who is coming in today to see if we will fill her premarin. She has been on it for some time and used to have some hot flashes. She does not want to be on it and has been off for several weeks. She is having mild hot flashes (which she can handle) but wants the medication in case she needs it. She is also having tenderness over some varicose veins. She has thought about getting them done but the insurance needs to wait.   Review of Systems  Constitutional: Negative for fever, activity change, appetite change, fatigue and unexpected weight change.  HENT: Negative.   Eyes: Negative.   Respiratory: Negative for cough, chest tightness, shortness of breath and wheezing.   Cardiovascular: Negative for chest pain, palpitations and leg swelling.  Gastrointestinal: Negative for abdominal pain, diarrhea, constipation and abdominal distention.  Musculoskeletal: Negative.   Skin: Negative.   Neurological: Negative.   Psychiatric/Behavioral: Negative.       Objective:   Physical Exam  Constitutional: She is oriented to person, place, and time. She appears well-developed and well-nourished. No distress.  HENT:  Head: Normocephalic and atraumatic.  Eyes: EOM are normal.  Neck: Normal range of motion.  Cardiovascular: Normal rate and regular rhythm.   Pulmonary/Chest: Effort normal and breath sounds normal. No respiratory distress. She has no wheezes. She has no rales.  Abdominal: Soft. Bowel sounds are normal.  Neurological: She is alert and oriented to person, place, and time. Coordination normal.  Skin: Skin is warm and dry.   Filed Vitals:   12/25/14 1108  BP: 106/64  Pulse: 64  Temp: 97.9 F (36.6 C)  TempSrc: Oral  Resp: 12  Height: 5\' 2"  (1.575 m)  Weight: 135 lb 3.2 oz (61.326 kg)  SpO2: 97%      Assessment & Plan:

## 2014-12-28 NOTE — Assessment & Plan Note (Signed)
At this time mild and have encouraged her to stay off the premarin, will send in one month supply and she will call if she needs to start taking it.

## 2015-01-07 ENCOUNTER — Ambulatory Visit
Admission: RE | Admit: 2015-01-07 | Discharge: 2015-01-07 | Disposition: A | Discharge status: Home / Self Care | Payer: Medicare Other | Source: Ambulatory Visit | Attending: Internal Medicine | Admitting: Internal Medicine

## 2015-01-07 DIAGNOSIS — R1012 Left upper quadrant pain: Secondary | ICD-10-CM

## 2015-01-14 ENCOUNTER — Telehealth: Payer: Self-pay | Admitting: Internal Medicine

## 2015-01-14 NOTE — Telephone Encounter (Signed)
Pt request test result that was done on 01/07/15. Please give her a call.

## 2015-01-15 NOTE — Telephone Encounter (Signed)
Patient aware of US results 

## 2015-01-15 NOTE — Telephone Encounter (Signed)
Please call and let her know that there were no problems on the ultrasound to explain the bump or her pain.

## 2015-05-27 ENCOUNTER — Encounter: Payer: Self-pay | Admitting: Internal Medicine

## 2015-05-27 ENCOUNTER — Ambulatory Visit (INDEPENDENT_AMBULATORY_CARE_PROVIDER_SITE_OTHER): Payer: Medicare Other | Admitting: Internal Medicine

## 2015-05-27 VITALS — BP 108/64 | HR 50 | Temp 98.1°F | Resp 12 | Ht 62.0 in | Wt 137.4 lb

## 2015-05-27 DIAGNOSIS — J302 Other seasonal allergic rhinitis: Secondary | ICD-10-CM | POA: Diagnosis not present

## 2015-05-27 MED ORDER — FLUTICASONE PROPIONATE 50 MCG/ACT NA SUSP: 2.0000 | Freq: Every day | NASAL | Status: DC

## 2015-05-27 NOTE — Progress Notes (Signed)
Pre visit review using our clinic review tool, if applicable. No additional management support is needed unless otherwise documented below in the visit note. 

## 2015-05-27 NOTE — Patient Instructions (Signed)
To help with the allergies I have sent in flonase which is a nose spray. Use 2 puffs in each nostril daily.   To clear the eardrums you can pinch the nostrils and blow against the closed nose to clear the tubes going to the ears.   Allergic Rhinitis Allergic rhinitis is when the mucous membranes in the nose respond to allergens. Allergens are particles in the air that cause your body to have an allergic reaction. This causes you to release allergic antibodies. Through a chain of events, these eventually cause you to release histamine into the blood stream. Although meant to protect the body, it is this release of histamine that causes your discomfort, such as frequent sneezing, congestion, and an itchy, runny nose.  CAUSES  Seasonal allergic rhinitis (hay fever) is caused by pollen allergens that may come from grasses, trees, and weeds. Year-round allergic rhinitis (perennial allergic rhinitis) is caused by allergens such as house dust mites, pet dander, and mold spores.  SYMPTOMS   Nasal stuffiness (congestion).  Itchy, runny nose with sneezing and tearing of the eyes. DIAGNOSIS  Your health care provider can help you determine the allergen or allergens that trigger your symptoms. If you and your health care provider are unable to determine the allergen, skin or blood testing may be used. TREATMENT  Allergic rhinitis does not have a cure, but it can be controlled by:  Medicines and allergy shots (immunotherapy).  Avoiding the allergen. Hay fever may often be treated with antihistamines in pill or nasal spray forms. Antihistamines block the effects of histamine. There are over-the-counter medicines that may help with nasal congestion and swelling around the eyes. Check with your health care provider before taking or giving this medicine.  If avoiding the allergen or the medicine prescribed do not work, there are many new medicines your health care provider can prescribe. Stronger medicine may  be used if initial measures are ineffective. Desensitizing injections can be used if medicine and avoidance does not work. Desensitization is when a patient is given ongoing shots until the body becomes less sensitive to the allergen. Make sure you follow up with your health care provider if problems continue. HOME CARE INSTRUCTIONS It is not possible to completely avoid allergens, but you can reduce your symptoms by taking steps to limit your exposure to them. It helps to know exactly what you are allergic to so that you can avoid your specific triggers. SEEK MEDICAL CARE IF:   You have a fever.  You develop a cough that does not stop easily (persistent).  You have shortness of breath.  You start wheezing.  Symptoms interfere with normal daily activities. Document Released: 08/22/2001 Document Revised: 12/02/2013 Document Reviewed: 08/04/2013 [facility] Patient Information 2015 [place], [place]. This information is not intended to replace advice given to you by your health care provider. Make sure you discuss any questions you have with your health care provider.

## 2015-05-27 NOTE — Progress Notes (Signed)
   Subjective:    Patient ID: [name], female    DOB: 1946/09/28, 69 y.o.   MRN: [id number]  HPI The patient is a 69 YO female who has been struggling with allergies for the last 5 weeks. She started taking zyrtec for them about 2-3 weeks ago and is improving overall. Mild nasal congestion and mild cough still. No fevers or chills. She is having some ear pain and drainage that is not improving. No SOB. Has not taken cold medication due to her high blood pressure.   Review of Systems  Constitutional: Negative for fever, activity change, appetite change, fatigue and unexpected weight change.  HENT: Positive for congestion, ear discharge and ear pain. Negative for postnasal drip, rhinorrhea, sinus pressure and sore throat.   Eyes: Negative.   Respiratory: Positive for cough. Negative for chest tightness, shortness of breath and wheezing.   Cardiovascular: Negative for chest pain, palpitations and leg swelling.  Gastrointestinal: Negative.       Objective:   Physical Exam  Constitutional: She is oriented to person, place, and time. She appears well-developed and well-nourished.  HENT:  Head: Normocephalic and atraumatic.  Eyes: EOM are normal.  Neck: Normal range of motion.  Cardiovascular: Normal rate and regular rhythm.   Pulmonary/Chest: Effort normal. No respiratory distress. She has no wheezes. She has no rales.  Abdominal: Soft. She exhibits no distension. There is no tenderness.  Musculoskeletal: She exhibits no edema.  Neurological: She is alert and oriented to person, place, and time. Coordination normal.  Skin: Skin is warm and dry.  Psychiatric: She has a normal mood and affect.   Filed Vitals:   05/27/15 1305  BP: 108/64  Pulse: 50  Temp: 98.1 F (36.7 C)  TempSrc: Oral  Resp: 12  Height: 5\' 2"  (1.575 m)  Weight: 137 lb 6.4 oz (62.324 kg)  SpO2: 90%      Assessment & Plan:

## 2015-05-28 DIAGNOSIS — J309 Allergic rhinitis, unspecified: Secondary | ICD-10-CM | POA: Insufficient documentation

## 2015-05-28 NOTE — Assessment & Plan Note (Signed)
Rx for flonase sent in to pharmacy. She can continue taking zyrtec which has been moderately effective. No indication for antibiotics today.

## 2015-06-08 ENCOUNTER — Other Ambulatory Visit: Payer: Self-pay | Admitting: Geriatric Medicine

## 2015-06-08 MED ORDER — VALSARTAN-HYDROCHLOROTHIAZIDE 320-12.5 MG PO TABS: 1.0000 | ORAL_TABLET | Freq: Every day | ORAL | Status: DC

## 2015-06-08 MED ORDER — METOPROLOL TARTRATE 100 MG PO TABS: 100.0000 mg | ORAL_TABLET | Freq: Every day | ORAL | Status: DC

## 2015-06-08 MED ORDER — AMLODIPINE BESYLATE 5 MG PO TABS: 5.0000 mg | ORAL_TABLET | Freq: Every day | ORAL | Status: DC

## 2015-06-08 MED ORDER — SIMVASTATIN 40 MG PO TABS: 40.0000 mg | ORAL_TABLET | Freq: Every day | ORAL | Status: DC

## 2015-06-09 ENCOUNTER — Other Ambulatory Visit: Payer: Self-pay | Admitting: Geriatric Medicine

## 2015-06-09 MED ORDER — METOPROLOL TARTRATE 100 MG PO TABS: 100.0000 mg | ORAL_TABLET | Freq: Every day | ORAL | Status: AC

## 2015-06-09 MED ORDER — SIMVASTATIN 40 MG PO TABS: ORAL_TABLET | ORAL | Status: AC

## 2015-07-19 ENCOUNTER — Telehealth: Payer: Self-pay | Admitting: Internal Medicine

## 2015-07-19 NOTE — Telephone Encounter (Signed)
Patient called in to reschedule her appointment with Dr. Doug Sou this week and she is out of her valsartan-hydrochlorothiazide (DIOVAN-HCT) 320-12.5 MG per tablet [[id number]]  I told her a prescription was sent to her mail order on 6/28, but she hasn't received them yet.  She is calling them to see where it is. Can we prescribe her a month for now so she has some.

## 2015-07-20 ENCOUNTER — Other Ambulatory Visit: Payer: Self-pay | Admitting: Geriatric Medicine

## 2015-07-20 MED ORDER — VALSARTAN-HYDROCHLOROTHIAZIDE 320-12.5 MG PO TABS: 1.0000 | ORAL_TABLET | Freq: Every day | ORAL | Status: DC

## 2015-07-20 NOTE — Telephone Encounter (Signed)
Sent 30 day supply to local pharmacy.  

## 2015-07-20 NOTE — Telephone Encounter (Signed)
Can you please check on this?

## 2015-07-22 ENCOUNTER — Ambulatory Visit: Payer: Medicare Other | Admitting: Internal Medicine

## 2015-08-05 ENCOUNTER — Ambulatory Visit: Payer: Medicare Other | Admitting: Internal Medicine

## 2015-09-02 ENCOUNTER — Encounter: Payer: Self-pay | Admitting: Internal Medicine

## 2015-09-02 ENCOUNTER — Ambulatory Visit (INDEPENDENT_AMBULATORY_CARE_PROVIDER_SITE_OTHER): Payer: Medicare Other | Admitting: Internal Medicine

## 2015-09-02 VITALS — BP 124/70 | HR 70 | Temp 97.8°F | Resp 16 | Wt 138.0 lb

## 2015-09-02 DIAGNOSIS — H6981 Other specified disorders of Eustachian tube, right ear: Secondary | ICD-10-CM

## 2015-09-02 DIAGNOSIS — J069 Acute upper respiratory infection, unspecified: Secondary | ICD-10-CM

## 2015-09-02 MED ORDER — CEFUROXIME AXETIL 500 MG PO TABS: 500.0000 mg | ORAL_TABLET | Freq: Two times a day (BID) | ORAL | Status: DC

## 2015-09-02 NOTE — Progress Notes (Signed)
   Subjective:    Patient ID: [name], female    DOB: 26-Jun-1946, 69 y.o.   MRN: [id number]  HPI  2 weeks ago she developed hoarseness and a scratchy sore throat. Subsequently she developed a nonproductive cough. As of 9/15 she was producing green sputum and also having green nasal discharge. As of 9/18 she developed discomfort and popping in the right ear. This was after [place].  She has associated occasional sneezing and some shortness of breath.  3 weeks ago she had a dental extraction on the right; she took one dose of amoxicillin at that time but developed cramping and diarrhea.  She took another dose 9/19 and had a similar episode of the same GI symptoms.  Significantly she keeps a 2-year-old and a 47-year-old on Mondays.  Review of Systems  She denies fever, chills, sweats.  She has no frontal sinus or facial pain. She denies itchy, watery eyes. She's had no associated wheezing.      Objective:   Physical Exam  General appearance:Adequately nourished; no acute distress or increased work of breathing is present.    Lymphatic: No  lymphadenopathy about the head, neck, or axilla .  Eyes: No conjunctival inflammation or lid edema is present. There is no scleral icterus.  Ears:  External ear exam shows no significant lesions or deformities.  Otoscopic examination reveals clear canals, tympanic membranes are intact bilaterally without bulging, retraction, inflammation or discharge. The right TM is slightly dull suggesting possible  serous otitis.  Nose:  External nasal examination shows no deformity or inflammation. Nasal mucosa are erythematous, especially the right without lesions or exudates No septal dislocation or deviation.No obstruction to airflow.   Oral exam: Dental hygiene is good; lips and gums are healthy appearing.There is no oropharyngeal erythema or exudate .  Neck:  No deformities, thyromegaly, masses, or tenderness noted.   Supple with  full range of motion without pain.   Heart:  Normal rate and regular rhythm. S1 and S2 normal without gallop, murmur, click, rub or other extra sounds.   Lungs:Chest clear to auscultation; no wheezes, rhonchi,rales ,or rubs present.  Extremities:  No cyanosis, edema, or clubbing  noted    Skin: Warm & dry w/o tenting or jaundice. No significant lesions or rash.        Assessment & Plan:  #1 upper respiratory tract infection  #2 nonallergic rhinitis  #3 probable right eustachian tube dysfunction  #3 amoxicillin GI intolerance  Plan: See orders

## 2015-09-02 NOTE — Progress Notes (Signed)
Pre visit review using our clinic review tool, if applicable. No additional management support is needed unless otherwise documented below in the visit note. 

## 2015-09-02 NOTE — Patient Instructions (Signed)
Plain Mucinex (NOT D) for thick secretions ;force NON dairy fluids .   Nasal cleansing in the shower as discussed with lather of mild shampoo.After 10 seconds wash off lather while  exhaling through nostrils. Make sure that all residual soap is removed to prevent irritation.  Fluticasone 1 spray in each nostril twice a day as needed. Use the "crossover" technique into opposite nostril spraying toward opposite ear @ 45 degree angle, not straight up into nostril.  Use a Neti pot daily only  as needed for significant sinus congestion; going from open side to congested side . Plain Allegra (NOT D )  160 daily , Loratidine 10 mg , OR Zyrtec 10 mg @ bedtime  as needed for itchy eyes & sneezing.     Fill the  prescription for antibiotic if fever, discolored nasal or chest secretions or significant pain above & below eyes appear in the next 48-72 hours.

## 2015-09-13 ENCOUNTER — Other Ambulatory Visit: Payer: Self-pay | Admitting: Geriatric Medicine

## 2015-09-13 MED ORDER — AMLODIPINE BESYLATE 5 MG PO TABS: 5.0000 mg | ORAL_TABLET | Freq: Every day | ORAL | Status: DC

## 2015-09-24 ENCOUNTER — Ambulatory Visit: Payer: Self-pay | Admitting: "Endocrinology

## 2015-10-26 LAB — HEMOGLOBIN A1C: Hgb A1c MFr Bld: 8.6 % — AB (ref 4.0–6.0)

## 2015-10-28 ENCOUNTER — Ambulatory Visit (INDEPENDENT_AMBULATORY_CARE_PROVIDER_SITE_OTHER): Payer: Medicare Other | Admitting: "Endocrinology

## 2015-10-28 ENCOUNTER — Encounter: Payer: Self-pay | Admitting: "Endocrinology

## 2015-10-28 VITALS — BP 113/65 | HR 59 | Ht 62.0 in | Wt 139.2 lb

## 2015-10-28 DIAGNOSIS — E785 Hyperlipidemia, unspecified: Secondary | ICD-10-CM

## 2015-10-28 DIAGNOSIS — E118 Type 2 diabetes mellitus with unspecified complications: Secondary | ICD-10-CM | POA: Diagnosis not present

## 2015-10-28 DIAGNOSIS — Z794 Long term (current) use of insulin: Secondary | ICD-10-CM | POA: Diagnosis not present

## 2015-10-28 DIAGNOSIS — E1165 Type 2 diabetes mellitus with hyperglycemia: Secondary | ICD-10-CM

## 2015-10-28 DIAGNOSIS — I10 Essential (primary) hypertension: Secondary | ICD-10-CM

## 2015-10-28 DIAGNOSIS — IMO0002 Reserved for concepts with insufficient information to code with codable children: Secondary | ICD-10-CM

## 2015-10-28 MED ORDER — HUMALOG KWIKPEN 100 UNIT/ML ~~LOC~~ SOPN: 5.0000 [IU] | PEN_INJECTOR | Freq: Three times a day (TID) | SUBCUTANEOUS | Status: DC

## 2015-10-28 MED ORDER — LANTUS SOLOSTAR 100 UNIT/ML ~~LOC~~ SOPN: 30.0000 [IU] | PEN_INJECTOR | Freq: Every day | SUBCUTANEOUS | Status: DC

## 2015-10-28 MED ORDER — CANAGLIFLOZIN 100 MG PO TABS: 100.0000 mg | ORAL_TABLET | Freq: Every day | ORAL | Status: DC

## 2015-10-28 MED ORDER — INSULIN GLARGINE 100 UNIT/ML ~~LOC~~ SOLN: 30.0000 [IU] | Freq: Every day | SUBCUTANEOUS | Status: DC

## 2015-10-28 NOTE — Progress Notes (Signed)
Subjective:    Patient ID: [name], female    DOB: [birth year], PCP Hoyt Koch, MD   Past Medical History  Diagnosis Date  . Anemia   . Arthritis   . Diabetes mellitus   . Hyperlipidemia   . Hypertension   . IBS (irritable bowel syndrome)   . Kidney stones   . Diverticulosis   . GERD (gastroesophageal reflux disease)    Past Surgical History  Procedure Laterality Date  . Abdominal hysterectomy    . Breast lumpectomy    . Abdominal exploration surgery  1970  . Cystocele repair    . Skin graph     Social History   Social History  . Marital Status: Married    Spouse Name: N/A  . Number of Children: N/A  . Years of Education: N/A   Occupational History  . nurse    Social History Main Topics  . Smoking status: Never Smoker   . Smokeless tobacco: Never Used  . Alcohol Use: No  . Drug Use: No  . Sexual Activity: Yes    Birth Control/ Protection: Post-menopausal   Other Topics Concern  . None   Social History Narrative   Outpatient Encounter Prescriptions as of 10/28/2015  Medication Sig  . ACCU-CHEK AVIVA PLUS test strip   . amLODipine (NORVASC) 5 MG tablet Take 1 tablet (5 mg total) by mouth daily.  . canagliflozin (INVOKANA) 100 MG TABS tablet Take 1 tablet (100 mg total) by mouth daily.  . cholecalciferol (VITAMIN D) 1000 UNITS tablet Take 1,000 Units by mouth daily.  . insulin glargine (LANTUS) 100 UNIT/ML injection Inject 0.3 mLs (30 Units total) into the skin at bedtime.  . Lancets (ACCU-CHEK MULTICLIX) lancets   . LANTUS SOLOSTAR 100 UNIT/ML Solostar Pen Inject 30 Units into the skin daily at 10 pm.  . metFORMIN (GLUCOPHAGE) 500 MG tablet Take 500 mg by mouth 2 (two) times daily.   . metoprolol (LOPRESSOR) 100 MG tablet Take 1 tablet (100 mg total) by mouth daily.  . simvastatin (ZOCOR) 40 MG tablet Take one half tablet by mouth daily.  . valsartan-hydrochlorothiazide (DIOVAN-HCT) 320-12.5 MG per tablet Take 1 tablet by mouth daily.  .  vitamin C (ASCORBIC ACID) 500 MG tablet Take 500 mg by mouth daily. 2 by mouth twice a day  . vitamin E 1000 UNIT capsule Take 1,000 Units by mouth daily.  . [DISCONTINUED] canagliflozin (INVOKANA) 100 MG TABS tablet Take 1 tablet (100 mg total) by mouth daily.  . [DISCONTINUED] insulin glargine (LANTUS) 100 UNIT/ML injection Inject 0.26 mL (26 Units total) into the skin at bedtime.  . [DISCONTINUED] INVOKANA 100 MG TABS 100 mg daily.   . [DISCONTINUED] LANTUS SOLOSTAR 100 UNIT/ML Solostar Pen Inject 30 Units into the skin daily at 10 pm.  . cefUROXime (CEFTIN) 500 MG tablet Take 1 tablet (500 mg total) by mouth 2 (two) times daily. (Patient not taking: Reported on 10/28/2015)  . HUMALOG KWIKPEN 100 UNIT/ML KiwkPen Inject 0.05-0.11 mLs (5-11 Units total) into the skin 3 (three) times daily before meals. Sliding scale  . omeprazole (PRILOSEC) 20 MG capsule Take 20 mg by mouth daily as needed.   . [DISCONTINUED] HUMALOG KWIKPEN 100 UNIT/ML KiwkPen Sliding scale  . [DISCONTINUED] HUMALOG KWIKPEN 100 UNIT/ML KiwkPen Inject 0.05-0.11 mLs (5-11 Units total) into the skin 3 (three) times daily before meals. Sliding scale   No facility-administered encounter medications on file as of 10/28/2015.   ALLERGIES: Allergies  Allergen Reactions  .  Amoxicillin     Abdominal cramping and diarrhea   VACCINATION STATUS: Immunization History  Administered Date(s) Administered  . Influenza Whole 09/24/2014  . Influenza-Unspecified 10/02/2013  . Pneumococcal Polysaccharide-23 09/29/2010  . Tdap 07/28/2009  . Zoster 07/25/2013    Diabetes She presents for her follow-up diabetic visit. She has type 2 diabetes mellitus. Onset time: She was diagnosed at approximate age of 56 years. Her disease course has been worsening. There are no hypoglycemic associated symptoms. Pertinent negatives for hypoglycemia include no confusion, headaches, pallor or seizures. There are no diabetic associated symptoms. Pertinent  negatives for diabetes include no chest pain, no polydipsia, no polyphagia and no polyuria. There are no hypoglycemic complications. Symptoms are worsening. There are no diabetic complications. Risk factors for coronary artery disease include dyslipidemia, diabetes mellitus, hypertension and sedentary lifestyle. Current diabetic treatment includes insulin injections and oral agent (dual therapy). She is compliant with treatment most of the time. Her weight is stable. She is following a generally unhealthy diet. She has not had a previous visit with a dietitian. She rarely participates in exercise. Her overall blood glucose range is 180-200 mg/dl. An ACE inhibitor/angiotensin II receptor blocker is being taken. Eye exam is current.  Hyperlipidemia This is a chronic problem. The current episode started more than 1 year ago. The problem is controlled. Exacerbating diseases include diabetes. Pertinent negatives include no chest pain, myalgias or shortness of breath. Current antihyperlipidemic treatment includes statins. Risk factors for coronary artery disease include dyslipidemia and hypertension.  Hypertension This is a chronic problem. The current episode started more than 1 year ago. The problem is controlled. Pertinent negatives include no chest pain, headaches, palpitations or shortness of breath. Past treatments include ACE inhibitors.     Review of Systems  Constitutional: Negative for unexpected weight change.  HENT: Negative for trouble swallowing and voice change.   Eyes: Negative for visual disturbance.  Respiratory: Negative for cough, shortness of breath and wheezing.   Cardiovascular: Negative for chest pain, palpitations and leg swelling.  Gastrointestinal: Negative for nausea, vomiting and diarrhea.  Endocrine: Negative for cold intolerance, heat intolerance, polydipsia, polyphagia and polyuria.  Musculoskeletal: Negative for myalgias and arthralgias.  Skin: Negative for color change,  pallor, rash and wound.  Neurological: Negative for seizures and headaches.  Psychiatric/Behavioral: Negative for suicidal ideas and confusion.    Objective:    BP 113/65 mmHg  Pulse 59  Ht 5\' 2"  (1.575 m)  Wt 139 lb 3.2 oz (63.141 kg)  BMI 25.45 kg/m2  SpO2 97%  Wt Readings from Last 3 Encounters:  10/28/15 139 lb 3.2 oz (63.141 kg)  09/02/15 138 lb (62.596 kg)  05/27/15 137 lb 6.4 oz (62.324 kg)    Physical Exam  Constitutional: She is oriented to person, place, and time. She appears well-developed.  HENT:  Head: Normocephalic and atraumatic.  Eyes: EOM are normal.  Neck: Normal range of motion. Neck supple. No tracheal deviation present. No thyromegaly present.  Cardiovascular: Normal rate and regular rhythm.   Pulmonary/Chest: Effort normal and breath sounds normal.  Abdominal: Soft. Bowel sounds are normal. There is no tenderness. There is no guarding.  Musculoskeletal: Normal range of motion. She exhibits no edema.  Neurological: She is alert and oriented to person, place, and time. She has normal reflexes. No cranial nerve deficit. Coordination normal.  Skin: Skin is warm and dry. No rash noted. No erythema. No pallor.  Psychiatric: She has a normal mood and affect. Judgment normal.    Results for  orders placed or performed in visit on 10/28/15  Hemoglobin A1c  Result Value Ref Range   Hgb A1c MFr Bld 8.6 (A) 4.0 - 6.0 %   Complete Blood Count (Most recent): Lab Results  Component Value Date   WBC 10.0 07/25/2013   HGB 12.2 07/25/2013   HCT 36.3 07/25/2013   MCV 84.3 07/25/2013   PLT 418.0* 07/25/2013   Chemistry (most recent): Lab Results  Component Value Date   NA 139 07/25/2013   K 4.5 07/25/2013   CL 103 07/25/2013   CO2 31 07/25/2013   BUN 17 07/25/2013   CREATININE 0.9 07/25/2013   Diabetic Labs (most recent): Lab Results  Component Value Date   HGBA1C 8.6* 10/26/2015   HGBA1C 7.7* 07/25/2013   Lipid profile (most recent): Lab Results   Component Value Date   TRIG 360.0* 07/25/2013   CHOL 179 07/25/2013     Assessment & Plan:   1. Uncontrolled type 2 diabetes mellitus with complication, with long-term current use of insulin ([place])  She remains at a high risk for more acute and chronic complications of diabetes which include CAD, CVA, CKD, retinopathy, and neuropathy. These are all discussed in detail with the patient.  Patient came with controlled fasting glucose profile, however   recent A1c of 8.6% and elevated from 8 %.  Glucose logs and insulin administration records pertaining to this visit,  to be scanned into patient's records.  Recent labs reviewed.   - I have re-counseled the patient on diet management   by adopting a carbohydrate restricted / protein rich  Diet.  - Suggestion is made for patient to avoid simple carbohydrates   from their diet including Cakes , Desserts, Ice Cream,  Soda (  diet and regular) , Sweet Tea , Candies,  Chips, Cookies, Artificial Sweeteners,   and "Sugar-free" Products .  This will help patient to have stable blood glucose profile and potentially avoid unintended  Weight gain.  - Patient is advised to stick to a routine mealtimes to eat 3 meals  a day and avoid unnecessary snacks ( to snack only to correct hypoglycemia).    - I have approached patient with the following individualized plan to manage diabetes and patient agrees.  - Continue Lantus 30 units qhs, resume Humalog 5 units TIDAC plus SSI, associated with monitoring BG AC and HS. -she is  Warned not to take insulin without monitoring property.  - Patient is encouraged to call clinic for blood glucose levels less than 70 or above 300 mg /dl.  - I will continue low dose MTF 500mg  po TID, and continue Invokana 100mg  po qday.   she will be back in 12 weeks with labs, meter and log for reevaluation and insulin dose adjustment.   - Patient specific target  for A1c; LDL, HDL, Triglycerides, and  Waist Circumference were  discussed in detail.  2) BP/HTN: Controlled. Continue current medications including ACEI/ARB. 3) Lipids/HPL:  continue statins. 4)  Weight/Diet: CDE consult in progress, exercise, and carbohydrates information provided.  5) Chronic Care/Health Maintenance:  -Patient is on ACEI/ARB and Statin medications and encouraged to continue to follow up with Ophthalmology, Podiatrist at least yearly or according to recommendations, and advised to  stay away from smoking. I have recommended yearly flu vaccine and pneumonia vaccination at least every 5 years; moderate intensity exercise for up to 150 minutes weekly; and  sleep for at least 7 hours a day.  - 25 minutes of time was spent on  the care of this patient , 50% of which was applied for counseling on diabetes complications and their preventions.  - I advised patient to maintain close follow up with their PCP for primary care needs.  Patient is asked to bring meter and  blood glucose logs during their next visit.   Follow up plan: -Return in about 3 months (around 01/28/2016) for diabetes, high blood pressure, high cholesterol.  Glade Lloyd, MD Phone: [phone]  Fax: [phone]   10/28/2015, 10:28 PM

## 2015-11-11 ENCOUNTER — Ambulatory Visit (INDEPENDENT_AMBULATORY_CARE_PROVIDER_SITE_OTHER): Payer: Medicare Other | Admitting: Internal Medicine

## 2015-11-11 ENCOUNTER — Encounter: Payer: Self-pay | Admitting: Internal Medicine

## 2015-11-11 VITALS — BP 122/76 | HR 67 | Temp 98.3°F | Resp 14 | Ht 62.0 in | Wt 139.0 lb

## 2015-11-11 DIAGNOSIS — R109 Unspecified abdominal pain: Secondary | ICD-10-CM | POA: Diagnosis not present

## 2015-11-11 NOTE — Progress Notes (Signed)
Pre visit review using our clinic review tool, if applicable. No additional management support is needed unless otherwise documented below in the visit note. 

## 2015-11-11 NOTE — Progress Notes (Signed)
   Subjective:    Patient ID: CHRISHANA ZWIEG, female    DOB: 10-21-1946, 69 y.o.   MRN: [id number]  HPI The patient is a 69 YO female coming in for left flank pain. Started several weeks ago and is severe. She is not able to sleep due to the pain. Worse with pressure. Able to eat and drink normally. No nausea or vomiting. No GERD symptoms. No constipation or diarrhea. No blood in stool. Prior ultrasound in Jan this year was normal. Pain is worse since that time. Not much in her back. No urinary symptoms or blood in urine.   Review of Systems  Constitutional: Negative for fever, activity change, appetite change, fatigue and unexpected weight change.  Eyes: Negative.   Respiratory: Positive for cough. Negative for chest tightness, shortness of breath and wheezing.   Cardiovascular: Negative for chest pain, palpitations and leg swelling.  Gastrointestinal: Positive for abdominal pain. Negative for nausea, vomiting, diarrhea, constipation and abdominal distention.  Musculoskeletal: Negative for myalgias and arthralgias.  Skin: Negative.       Objective:   Physical Exam  Constitutional: She is oriented to person, place, and time. She appears well-developed and well-nourished.  HENT:  Head: Normocephalic and atraumatic.  Eyes: EOM are normal.  Neck: Normal range of motion.  Cardiovascular: Normal rate and regular rhythm.   Pulmonary/Chest: Effort normal. No respiratory distress. She has no wheezes. She has no rales.  Abdominal: Soft. She exhibits no distension. There is no tenderness.  Musculoskeletal: She exhibits no edema.  Neurological: She is alert and oriented to person, place, and time. Coordination normal.  Skin: Skin is warm and dry.  Psychiatric: She has a normal mood and affect.   Filed Vitals:   11/11/15 1026  BP: 122/76  Pulse: 67  Temp: 98.3 F (36.8 C)  TempSrc: Oral  Resp: 14  Height: 5\' 2"  (1.575 m)  Weight: 139 lb (63.05 kg)  SpO2: 97%      Assessment & Plan:

## 2015-11-11 NOTE — Assessment & Plan Note (Signed)
Check CT abdomen without contrast to evaluate for kidney stones. Reassured that this is not likely anything serious given the normal [place] in Jan. Advised she can try gas-x to see if it is just gas.

## 2015-11-11 NOTE — Patient Instructions (Signed)
We will check the Ct scan of the stomach to make sure you do not have kidney stones.   Try using gas-x to see if this helps. If it goes away with gas-x you may not need to get the scan.

## 2015-11-19 ENCOUNTER — Ambulatory Visit
Admission: RE | Admit: 2015-11-19 | Discharge: 2015-11-19 | Disposition: A | Discharge status: Home / Self Care | Payer: Medicare Other | Source: Ambulatory Visit | Attending: Internal Medicine | Admitting: Internal Medicine

## 2015-11-19 DIAGNOSIS — R109 Unspecified abdominal pain: Secondary | ICD-10-CM

## 2015-12-11 ENCOUNTER — Encounter: Payer: Self-pay | Admitting: Internal Medicine

## 2015-12-11 ENCOUNTER — Ambulatory Visit (INDEPENDENT_AMBULATORY_CARE_PROVIDER_SITE_OTHER): Payer: Medicare Other | Admitting: Internal Medicine

## 2015-12-11 VITALS — BP 120/80 | Temp 98.2°F | Wt 140.0 lb

## 2015-12-11 DIAGNOSIS — M792 Neuralgia and neuritis, unspecified: Secondary | ICD-10-CM

## 2015-12-11 DIAGNOSIS — R109 Unspecified abdominal pain: Secondary | ICD-10-CM | POA: Diagnosis not present

## 2015-12-11 DIAGNOSIS — M549 Dorsalgia, unspecified: Secondary | ICD-10-CM | POA: Diagnosis not present

## 2015-12-11 MED ORDER — PREDNISONE 20 MG PO TABS: ORAL_TABLET | ORAL | Status: DC

## 2015-12-11 NOTE — Patient Instructions (Signed)
Neuropathic Pain Neuropathic pain is pain caused by damage to the nerves that are responsible for certain sensations in your body (sensory nerves). The pain can be caused by damage to:   The sensory nerves that send signals to your spinal cord and brain (peripheral nervous system).  The sensory nerves in your brain or spinal cord (central nervous system). Neuropathic pain can make you more sensitive to pain. What would be a minor sensation for most people may feel very painful if you have neuropathic pain. This is usually a long-term condition that can be difficult to treat. The type of pain can differ from person to person. It may start suddenly (acute), or it may develop slowly and last for a long time (chronic). Neuropathic pain may come and go as damaged nerves heal or may stay at the same level for years. It often causes emotional distress, loss of sleep, and a lower quality of life. CAUSES  The most common cause of damage to a sensory nerve is diabetes. Many other diseases and conditions can also cause neuropathic pain. Causes of neuropathic pain can be classified as:  Toxic. Many drugs and chemicals can cause toxic damage. The most common cause of toxic neuropathic pain is damage from drug treatment for cancer (chemotherapy).  Metabolic. This type of pain can happen when a disease causes imbalances that damage nerves. Diabetes is the most common of these diseases. Vitamin B deficiency caused by long-term alcohol abuse is another common cause.  Traumatic. Any injury that cuts, crushes, or stretches a nerve can cause damage and pain. A common example is feeling pain after losing an arm or leg (phantom limb pain).  Compression-related. If a sensory nerve gets trapped or compressed for a long period of time, the blood supply to the nerve can be cut off.  Vascular. Many blood vessel diseases can cause neuropathic pain by decreasing blood supply and oxygen to nerves.  Autoimmune. This type of  pain results from diseases in which the body's defense system mistakenly attacks sensory nerves. Examples of autoimmune diseases that can cause neuropathic pain include lupus and multiple sclerosis.  Infectious. Many types of viral infections can damage sensory nerves and cause pain. Shingles infection is a common cause of this type of pain.  Inherited. Neuropathic pain can be a symptom of many diseases that are passed down through families (genetic). SIGNS AND SYMPTOMS  The main symptom is pain. Neuropathic pain is often described as:  Burning.  Shock-like.  Stinging.  Hot or cold.  Itching. DIAGNOSIS  No single test can diagnose neuropathic pain. Your health care provider will do a physical exam and ask you about your pain. You may use a pain scale to describe how bad your pain is. You may also have tests to see if you have a high sensitivity to pain and to help find the cause and location of any sensory nerve damage. These tests may include:  Imaging studies, such as:  X-rays.  CT scan.  MRI.  Nerve conduction studies to test how well nerve signals travel through your sensory nerves (electrodiagnostic testing).  Stimulating your sensory nerves through electrodes on your skin and measuring the response in your spinal cord and brain (somatosensory evoked potentials). TREATMENT  Treatment for neuropathic pain may change over time. You may need to try different treatment options or a combination of treatments. Some options include:  Over-the-counter pain relievers.  Prescription medicines. Some medicines used to treat other conditions may also help neuropathic pain. These   include medicines to:  Control seizures (anticonvulsants).  Relieve depression (antidepressants).  Prescription-strength pain relievers (narcotics). These are usually used when other pain relievers do not help.  Transcutaneous nerve stimulation (TENS). This uses electrical currents to block painful nerve  signals. The treatment is painless.  Topical and local anesthetics. These are medicines that numb the nerves. They can be injected as a nerve block or applied to the skin.  Alternative treatments, such as:  Acupuncture.  Meditation.  Massage.  Physical therapy.  Pain management programs.  Counseling. HOME CARE INSTRUCTIONS  Learn as much as you can about your condition.  Take medicines only as directed by your health care provider.  Work closely with all your health care providers to find what works best for you.  Have a good support system at home.  Consider joining a chronic pain support group. SEEK MEDICAL CARE IF:  Your pain treatments are not helping.  You are having side effects from your medicines.  You are struggling with fatigue, mood changes, depression, or anxiety.   This information is not intended to replace advice given to you by your health care provider. Make sure you discuss any questions you have with your health care provider.   Document Released: 08/24/2004 Document Revised: 12/18/2014 Document Reviewed: 05/07/2014 [employer] Interactive Patient Education 2016 [employer]  

## 2015-12-11 NOTE — Progress Notes (Signed)
Pre visit review using our clinic review tool, if applicable. No additional management support is needed unless otherwise documented below in the visit note. 

## 2015-12-11 NOTE — Progress Notes (Signed)
Subjective:    Patient ID: [name], female    DOB: June 16, 1946, 69 y.o.   MRN: [id number]  HPI  Pt presents to the clinic today with c/o pain that starts in her left back and radiates around to left side of abdomen. This started 1 month ago. She describes the pain as burning and numb. The area is even painful for her clothes to touch. She has not noticed any rash. She denies any injury to her back or side. She denies urinary, abdominal or vaginal complaints. Her bowels are moving normally. She has had shingles vaccine. She has been taking Lyrica but reports it has not helped. She has also been using a menthol/lidocaine cream with some relief. She has seen her PCP 12/1 for the same. CT scan of abdomen was normal.   Review of Systems      Past Medical History  Diagnosis Date  . Anemia   . Arthritis   . Diabetes mellitus   . Hyperlipidemia   . Hypertension   . IBS (irritable bowel syndrome)   . Kidney stones   . Diverticulosis   . GERD (gastroesophageal reflux disease)     Current Outpatient Prescriptions  Medication Sig Dispense Refill  . ACCU-CHEK AVIVA PLUS test strip     . amLODipine (NORVASC) 5 MG tablet Take 1 tablet (5 mg total) by mouth daily. 90 tablet 3  . canagliflozin (INVOKANA) 100 MG TABS tablet Take 1 tablet (100 mg total) by mouth daily. 30 tablet 2  . cholecalciferol (VITAMIN D) 1000 UNITS tablet Take 1,000 Units by mouth daily.    Marland Kitchen HUMALOG KWIKPEN 100 UNIT/ML KiwkPen Inject 0.05-0.11 mLs (5-11 Units total) into the skin 3 (three) times daily before meals. Sliding scale 15 mL 2  . insulin glargine (LANTUS) 100 UNIT/ML injection Inject 0.3 mLs (30 Units total) into the skin at bedtime. 10 mL 2  . Lancets (ACCU-CHEK MULTICLIX) lancets     . LANTUS SOLOSTAR 100 UNIT/ML Solostar Pen Inject 30 Units into the skin daily at 10 pm. 15 mL 2  . metFORMIN (GLUCOPHAGE) 500 MG tablet Take 500 mg by mouth 2 (two) times daily.     . metoprolol (LOPRESSOR) 100 MG tablet Take  1 tablet (100 mg total) by mouth daily. 90 tablet 3  . omeprazole (PRILOSEC) 20 MG capsule Take 20 mg by mouth daily as needed.     . pregabalin (LYRICA) 75 MG capsule Take 75 mg by mouth 2 (two) times daily.    . simvastatin (ZOCOR) 40 MG tablet Take one half tablet by mouth daily. 45 tablet 3  . valsartan-hydrochlorothiazide (DIOVAN-HCT) 320-12.5 MG per tablet Take 1 tablet by mouth daily. 30 tablet 0  . vitamin C (ASCORBIC ACID) 500 MG tablet Take 500 mg by mouth daily. 2 by mouth twice a day    . vitamin E 1000 UNIT capsule Take 1,000 Units by mouth daily.    . predniSONE (DELTASONE) 20 MG tablet Take 3 tabs on days 1-3, take 2 tabs on days 4-6, take 1 tab on days 7-9 18 tablet 0   No current facility-administered medications for this visit.    Allergies  Allergen Reactions  . Amoxicillin     Abdominal cramping and diarrhea    Family History  Problem Relation Age of Onset  . Liver cancer Mother   . Ovarian cancer Mother   . Diabetes Mother   . Cancer Mother     ovarian  . Diabetes Father   .  Heart disease Father   . Heart disease Brother   . Early death Neg Hx   . Hyperlipidemia Neg Hx   . Hypertension Neg Hx   . Kidney disease Neg Hx   . Stroke Neg Hx   . Alcohol abuse Neg Hx   . Arthritis Neg Hx     Social History   Social History  . Marital Status: Married    Spouse Name: N/A  . Number of Children: N/A  . Years of Education: N/A   Occupational History  . nurse    Social History Main Topics  . Smoking status: Never Smoker   . Smokeless tobacco: Never Used  . Alcohol Use: No  . Drug Use: No  . Sexual Activity: Yes    Birth Control/ Protection: Post-menopausal   Other Topics Concern  . Not on file   Social History Narrative     Constitutional: Denies fever, malaise, fatigue, headache or abrupt weight changes.  Respiratory: Denies difficulty breathing, shortness of breath, cough or sputum production.   Cardiovascular: Denies chest pain, chest  tightness, palpitations or swelling in the hands or feet.  Gastrointestinal: Denies abdominal pain, bloating, constipation, diarrhea or blood in the stool.  GU: Denies urgency, frequency, pain with urination, burning sensation, blood in urine, odor or discharge. Musculoskeletal: Pt reports left side back pain. Denies decrease in range of motion, difficulty with gait, muscle pain or joint pain and swelling.  Skin: Denies redness, rashes, lesions or ulcercations.  Neurological: Pt reports burning and numbness sensation of left back/abdomen. Denies dizziness, difficulty with memory, difficulty with speech or problems with balance and coordination.   No other specific complaints in a complete review of systems (except as listed in HPI above).  Objective:   Physical Exam  BP 120/80 mmHg  Temp(Src) 98.2 F (36.8 C) (Oral)  Wt 140 lb (63.504 kg) Wt Readings from Last 3 Encounters:  12/11/15 140 lb (63.504 kg)  11/11/15 139 lb (63.05 kg)  10/28/15 139 lb 3.2 oz (63.141 kg)    General: Appears her stated age, well developed, well nourished in NAD. Skin: Warm, dry and intact. No rashes, lesions or ulcerations noted. Cardiovascular: Normal rate and rhythm.  Pulmonary/Chest: Normal effort and positive vesicular breath sounds. No respiratory distress. No wheezes, rales or ronchi noted.  Abdomen: Soft and nontender. Normal bowel sounds. No distention or masses noted. No CVA tenderness noted. Musculoskeletal: Normal flexion, extension and rotation of the spine. No bony tenderness noted over the cervical or thoracic spine. Neurological: Alert and oriented. Sensation heightened of the left back and abdomen.   BMET    Component Value Date/Time   NA 139 07/25/2013 1605   K 4.5 07/25/2013 1605   CL 103 07/25/2013 1605   CO2 31 07/25/2013 1605   GLUCOSE 145* 07/25/2013 1605   BUN 17 07/25/2013 1605   CREATININE 0.9 07/25/2013 1605   CALCIUM 9.4 07/25/2013 1605   GFRNONAA >60 06/20/2011 0448    GFRAA >60 06/20/2011 0448    Lipid Panel     Component Value Date/Time   CHOL 179 07/25/2013 1605   TRIG 360.0* 07/25/2013 1605   HDL 61.10 07/25/2013 1605   CHOLHDL 3 07/25/2013 1605   VLDL 72.0* 07/25/2013 1605    CBC    Component Value Date/Time   WBC 10.0 07/25/2013 1605   RBC 4.31 07/25/2013 1605   HGB 12.2 07/25/2013 1605   HCT 36.3 07/25/2013 1605   PLT 418.0* 07/25/2013 1605   MCV 84.3 07/25/2013 1605  MCH 28.5 06/12/2011 1040   MCHC 33.6 07/25/2013 1605   RDW 14.4 07/25/2013 1605   LYMPHSABS 3.4 07/25/2013 1605   MONOABS 0.6 07/25/2013 1605   EOSABS 0.1 07/25/2013 1605   BASOSABS 0.0 07/25/2013 1605    Hgb A1C Lab Results  Component Value Date   HGBA1C 8.6* 10/26/2015        Assessment & Plan:   Neuropathic pain of left back/abdomen:  ? Nerve irritation Continue Lyrica Will try a round of prednisone (advised her to keep a close check on her blood sugars) If no improvement, consider thoracic xray followed by thoracic MRI She does request a open MRI if needed  RTC as needed or if symptoms persist or worsen

## 2015-12-16 ENCOUNTER — Other Ambulatory Visit: Payer: Self-pay | Admitting: "Endocrinology

## 2015-12-24 ENCOUNTER — Telehealth: Payer: Self-pay | Admitting: Internal Medicine

## 2015-12-24 NOTE — Telephone Encounter (Signed)
Pt called stating she saw you 12/31 in [place].  She said predizone is not helping.  She wanted to go to the next step  Which would be a MRI Could you put her a referral in for this  This will have to be an open mri   Pt would like to go to [facility]  Pt would like to go to first available

## 2015-12-25 ENCOUNTER — Other Ambulatory Visit: Payer: Self-pay | Admitting: Internal Medicine

## 2015-12-25 DIAGNOSIS — M792 Neuralgia and neuritis, unspecified: Secondary | ICD-10-CM

## 2015-12-31 ENCOUNTER — Ambulatory Visit (HOSPITAL_COMMUNITY): Admission: RE | Admit: 2015-12-31 | Payer: Medicare HMO | Source: Ambulatory Visit

## 2016-01-08 ENCOUNTER — Ambulatory Visit
Admission: RE | Admit: 2016-01-08 | Discharge: 2016-01-08 | Disposition: A | Discharge status: Home / Self Care | Payer: Medicare HMO | Source: Ambulatory Visit | Attending: Internal Medicine | Admitting: Internal Medicine

## 2016-01-08 DIAGNOSIS — M792 Neuralgia and neuritis, unspecified: Secondary | ICD-10-CM

## 2016-01-08 DIAGNOSIS — R079 Chest pain, unspecified: Secondary | ICD-10-CM | POA: Diagnosis not present

## 2016-01-11 ENCOUNTER — Ambulatory Visit: Payer: Medicare HMO

## 2016-01-14 DIAGNOSIS — M9903 Segmental and somatic dysfunction of lumbar region: Secondary | ICD-10-CM | POA: Diagnosis not present

## 2016-01-14 DIAGNOSIS — R1084 Generalized abdominal pain: Secondary | ICD-10-CM | POA: Diagnosis not present

## 2016-01-14 DIAGNOSIS — G580 Intercostal neuropathy: Secondary | ICD-10-CM | POA: Diagnosis not present

## 2016-01-14 DIAGNOSIS — M9902 Segmental and somatic dysfunction of thoracic region: Secondary | ICD-10-CM | POA: Diagnosis not present

## 2016-01-18 DIAGNOSIS — G580 Intercostal neuropathy: Secondary | ICD-10-CM | POA: Diagnosis not present

## 2016-01-18 DIAGNOSIS — M9903 Segmental and somatic dysfunction of lumbar region: Secondary | ICD-10-CM | POA: Diagnosis not present

## 2016-01-18 DIAGNOSIS — M9902 Segmental and somatic dysfunction of thoracic region: Secondary | ICD-10-CM | POA: Diagnosis not present

## 2016-01-20 DIAGNOSIS — M9902 Segmental and somatic dysfunction of thoracic region: Secondary | ICD-10-CM | POA: Diagnosis not present

## 2016-01-20 DIAGNOSIS — M9903 Segmental and somatic dysfunction of lumbar region: Secondary | ICD-10-CM | POA: Diagnosis not present

## 2016-01-20 DIAGNOSIS — G580 Intercostal neuropathy: Secondary | ICD-10-CM | POA: Diagnosis not present

## 2016-01-25 DIAGNOSIS — M9902 Segmental and somatic dysfunction of thoracic region: Secondary | ICD-10-CM | POA: Diagnosis not present

## 2016-01-25 DIAGNOSIS — M9903 Segmental and somatic dysfunction of lumbar region: Secondary | ICD-10-CM | POA: Diagnosis not present

## 2016-01-25 DIAGNOSIS — G580 Intercostal neuropathy: Secondary | ICD-10-CM | POA: Diagnosis not present

## 2016-01-25 DIAGNOSIS — M9905 Segmental and somatic dysfunction of pelvic region: Secondary | ICD-10-CM | POA: Diagnosis not present

## 2016-01-27 DIAGNOSIS — G580 Intercostal neuropathy: Secondary | ICD-10-CM | POA: Diagnosis not present

## 2016-01-27 DIAGNOSIS — M9902 Segmental and somatic dysfunction of thoracic region: Secondary | ICD-10-CM | POA: Diagnosis not present

## 2016-01-27 DIAGNOSIS — M9905 Segmental and somatic dysfunction of pelvic region: Secondary | ICD-10-CM | POA: Diagnosis not present

## 2016-01-27 DIAGNOSIS — M9903 Segmental and somatic dysfunction of lumbar region: Secondary | ICD-10-CM | POA: Diagnosis not present

## 2016-02-01 DIAGNOSIS — M9905 Segmental and somatic dysfunction of pelvic region: Secondary | ICD-10-CM | POA: Diagnosis not present

## 2016-02-01 DIAGNOSIS — G580 Intercostal neuropathy: Secondary | ICD-10-CM | POA: Diagnosis not present

## 2016-02-01 DIAGNOSIS — M9903 Segmental and somatic dysfunction of lumbar region: Secondary | ICD-10-CM | POA: Diagnosis not present

## 2016-02-01 DIAGNOSIS — M9902 Segmental and somatic dysfunction of thoracic region: Secondary | ICD-10-CM | POA: Diagnosis not present

## 2016-02-03 ENCOUNTER — Ambulatory Visit: Payer: Medicare Other | Admitting: "Endocrinology

## 2016-02-04 DIAGNOSIS — R1084 Generalized abdominal pain: Secondary | ICD-10-CM | POA: Diagnosis not present

## 2016-02-04 DIAGNOSIS — M9903 Segmental and somatic dysfunction of lumbar region: Secondary | ICD-10-CM | POA: Diagnosis not present

## 2016-02-04 DIAGNOSIS — M9902 Segmental and somatic dysfunction of thoracic region: Secondary | ICD-10-CM | POA: Diagnosis not present

## 2016-02-04 DIAGNOSIS — G580 Intercostal neuropathy: Secondary | ICD-10-CM | POA: Diagnosis not present

## 2016-02-11 DIAGNOSIS — G580 Intercostal neuropathy: Secondary | ICD-10-CM | POA: Diagnosis not present

## 2016-02-11 DIAGNOSIS — M9903 Segmental and somatic dysfunction of lumbar region: Secondary | ICD-10-CM | POA: Diagnosis not present

## 2016-02-11 DIAGNOSIS — M9905 Segmental and somatic dysfunction of pelvic region: Secondary | ICD-10-CM | POA: Diagnosis not present

## 2016-02-11 DIAGNOSIS — M9902 Segmental and somatic dysfunction of thoracic region: Secondary | ICD-10-CM | POA: Diagnosis not present

## 2016-02-15 ENCOUNTER — Other Ambulatory Visit (INDEPENDENT_AMBULATORY_CARE_PROVIDER_SITE_OTHER): Payer: Medicare HMO

## 2016-02-15 ENCOUNTER — Ambulatory Visit (INDEPENDENT_AMBULATORY_CARE_PROVIDER_SITE_OTHER): Payer: Medicare HMO | Admitting: Internal Medicine

## 2016-02-15 ENCOUNTER — Encounter: Payer: Self-pay | Admitting: Internal Medicine

## 2016-02-15 VITALS — BP 112/58 | HR 83 | Temp 98.2°F | Resp 14 | Ht 62.0 in | Wt 139.0 lb

## 2016-02-15 DIAGNOSIS — M791 Myalgia, unspecified site: Secondary | ICD-10-CM

## 2016-02-15 DIAGNOSIS — Z794 Long term (current) use of insulin: Secondary | ICD-10-CM

## 2016-02-15 DIAGNOSIS — R109 Unspecified abdominal pain: Secondary | ICD-10-CM

## 2016-02-15 DIAGNOSIS — IMO0001 Reserved for inherently not codable concepts without codable children: Secondary | ICD-10-CM

## 2016-02-15 DIAGNOSIS — E1165 Type 2 diabetes mellitus with hyperglycemia: Secondary | ICD-10-CM

## 2016-02-15 LAB — LIPID PANEL
CHOLESTEROL: 165 mg/dL (ref 0–200)
HDL: 45.9 mg/dL (ref >39.00)
NONHDL: 119.03
TRIGLYCERIDES: 227 mg/dL — AB (ref 0.0–149.0)
Total CHOL/HDL Ratio: 4
VLDL: 45.4 mg/dL — ABNORMAL HIGH (ref 0.0–40.0)

## 2016-02-15 LAB — SEDIMENTATION RATE: Sed Rate: 30 mm/hr — ABNORMAL HIGH (ref 0–22)

## 2016-02-15 LAB — COMPREHENSIVE METABOLIC PANEL
ALBUMIN: 4.4 g/dL (ref 3.5–5.2)
ALT: 14 U/L (ref 0–35)
AST: 14 U/L (ref 0–37)
Alkaline Phosphatase: 62 U/L (ref 39–117)
BILIRUBIN TOTAL: 0.3 mg/dL (ref 0.2–1.2)
BUN: 18 mg/dL (ref 6–23)
CALCIUM: 9.8 mg/dL (ref 8.4–10.5)
CO2: 29 mEq/L (ref 19–32)
CREATININE: 0.68 mg/dL (ref 0.40–1.20)
Chloride: 102 mEq/L (ref 96–112)
GFR: 90.43 mL/min (ref >60.00)
Glucose, Bld: 178 mg/dL — ABNORMAL HIGH (ref 70–99)
Potassium: 4 mEq/L (ref 3.5–5.1)
SODIUM: 142 meq/L (ref 135–145)
Total Protein: 7.2 g/dL (ref 6.0–8.3)

## 2016-02-15 LAB — CK: CK TOTAL: 75 U/L (ref 7–177)

## 2016-02-15 LAB — TSH: TSH: 2.48 u[IU]/mL (ref 0.35–4.50)

## 2016-02-15 LAB — LDL CHOLESTEROL, DIRECT: Direct LDL: 85 mg/dL

## 2016-02-15 LAB — HEMOGLOBIN A1C: Hgb A1c MFr Bld: 9 % — ABNORMAL HIGH (ref 4.6–6.5)

## 2016-02-15 LAB — T4, FREE: Free T4: 0.84 ng/dL (ref 0.60–1.60)

## 2016-02-15 NOTE — Patient Instructions (Signed)
We will check the blood work today and call you back with the results.    

## 2016-02-15 NOTE — Progress Notes (Signed)
   Subjective:    Patient ID: [name], female    DOB: 01-29-1946, 70 y.o.   MRN: [id number]  HPI The patient is a 70 YO female coming in for ongoing left sided pain in the back which radiates into the side. This has been going on for 6-12 months. Is fairly consistent and most days. She was seen and tried on prednisone which was not helpful. She then had MRI thoracic which did not have any findings. She has been trying chiropractor which is sometimes helpful and she is also doing acupuncture from them. Tried lyrica in the past and no help. No rash on the skin.   Review of Systems  Constitutional: Negative for fever, activity change, appetite change, fatigue and unexpected weight change.  Eyes: Negative.   Respiratory: Negative for cough, chest tightness, shortness of breath and wheezing.   Cardiovascular: Negative for chest pain, palpitations and leg swelling.  Gastrointestinal: Positive for abdominal pain. Negative for nausea, vomiting, diarrhea, constipation and abdominal distention.  Musculoskeletal: Positive for back pain. Negative for myalgias and arthralgias.  Skin: Negative.   Neurological: Negative.       Objective:   Physical Exam  Constitutional: She is oriented to person, place, and time. She appears well-developed and well-nourished.  HENT:  Head: Normocephalic and atraumatic.  Eyes: EOM are normal.  Neck: Normal range of motion.  Cardiovascular: Normal rate and regular rhythm.   Pulmonary/Chest: Effort normal. No respiratory distress. She has no wheezes. She has no rales.  Abdominal: Soft. She exhibits no distension. There is no tenderness.  Musculoskeletal: She exhibits no edema.  Neurological: She is alert and oriented to person, place, and time. Coordination normal.  Skin: Skin is warm and dry.  Psychiatric: She has a normal mood and affect.   Filed Vitals:   02/15/16 1514  BP: 112/58  Pulse: 83  Temp: 98.2 F (36.8 C)  TempSrc: Oral  Resp: 14  Height: 5'  2" (1.575 m)  Weight: 139 lb (63.05 kg)  SpO2: 98%      Assessment & Plan:

## 2016-02-15 NOTE — Assessment & Plan Note (Signed)
Checking her labs today and have asked her to return to her endocrinologist for further management of her uncontrolled diabetes.

## 2016-02-15 NOTE — Assessment & Plan Note (Signed)
She has now had extensive imaging which was non-revealing. Without cause would not recommend any further imaging studies. Will check CK, ESR, thyroid, CMP today to see if there are any metabolic causes. She will continue with acupuncture.

## 2016-02-15 NOTE — Progress Notes (Signed)
Pre visit review using our clinic review tool, if applicable. No additional management support is needed unless otherwise documented below in the visit note. 

## 2016-02-16 LAB — ANA: ANA: NEGATIVE

## 2016-03-10 DIAGNOSIS — Z1231 Encounter for screening mammogram for malignant neoplasm of breast: Secondary | ICD-10-CM | POA: Diagnosis not present

## 2016-05-05 DIAGNOSIS — E1165 Type 2 diabetes mellitus with hyperglycemia: Secondary | ICD-10-CM | POA: Diagnosis not present

## 2016-05-05 DIAGNOSIS — E782 Mixed hyperlipidemia: Secondary | ICD-10-CM | POA: Diagnosis not present

## 2016-05-05 DIAGNOSIS — I10 Essential (primary) hypertension: Secondary | ICD-10-CM | POA: Diagnosis not present

## 2016-05-09 ENCOUNTER — Other Ambulatory Visit: Payer: Self-pay

## 2016-05-09 DIAGNOSIS — E782 Mixed hyperlipidemia: Secondary | ICD-10-CM | POA: Diagnosis not present

## 2016-05-09 DIAGNOSIS — E119 Type 2 diabetes mellitus without complications: Secondary | ICD-10-CM | POA: Diagnosis not present

## 2016-05-09 MED ORDER — CANAGLIFLOZIN 100 MG PO TABS: ORAL_TABLET | ORAL | Status: DC

## 2016-05-16 DIAGNOSIS — I10 Essential (primary) hypertension: Secondary | ICD-10-CM | POA: Diagnosis not present

## 2016-05-16 DIAGNOSIS — E1165 Type 2 diabetes mellitus with hyperglycemia: Secondary | ICD-10-CM | POA: Diagnosis not present

## 2016-07-04 DIAGNOSIS — E1165 Type 2 diabetes mellitus with hyperglycemia: Secondary | ICD-10-CM | POA: Diagnosis not present

## 2016-07-04 DIAGNOSIS — E7800 Pure hypercholesterolemia, unspecified: Secondary | ICD-10-CM | POA: Diagnosis not present

## 2016-07-04 DIAGNOSIS — I10 Essential (primary) hypertension: Secondary | ICD-10-CM | POA: Diagnosis not present

## 2016-07-04 DIAGNOSIS — Z79899 Other long term (current) drug therapy: Secondary | ICD-10-CM | POA: Diagnosis not present

## 2016-07-04 DIAGNOSIS — D649 Anemia, unspecified: Secondary | ICD-10-CM | POA: Diagnosis not present

## 2016-07-31 ENCOUNTER — Telehealth: Payer: Self-pay

## 2016-07-31 NOTE — Telephone Encounter (Signed)
Patient is on the list for [employer] 2017 and may be a good candidate for an AWV in 2017. Please let me know if/when appt is scheduled.   

## 2016-08-02 ENCOUNTER — Other Ambulatory Visit: Payer: Self-pay | Admitting: "Endocrinology

## 2016-08-21 ENCOUNTER — Other Ambulatory Visit: Payer: Self-pay | Admitting: Internal Medicine

## 2016-09-01 DIAGNOSIS — L821 Other seborrheic keratosis: Secondary | ICD-10-CM | POA: Diagnosis not present

## 2016-09-01 DIAGNOSIS — L718 Other rosacea: Secondary | ICD-10-CM | POA: Diagnosis not present

## 2016-09-01 DIAGNOSIS — Z85828 Personal history of other malignant neoplasm of skin: Secondary | ICD-10-CM | POA: Diagnosis not present

## 2016-09-24 ENCOUNTER — Other Ambulatory Visit: Payer: Self-pay | Admitting: Internal Medicine

## 2016-10-17 DIAGNOSIS — Z0000 Encounter for general adult medical examination without abnormal findings: Secondary | ICD-10-CM | POA: Diagnosis not present

## 2016-10-30 DIAGNOSIS — E782 Mixed hyperlipidemia: Secondary | ICD-10-CM | POA: Diagnosis not present

## 2016-10-30 DIAGNOSIS — E119 Type 2 diabetes mellitus without complications: Secondary | ICD-10-CM | POA: Diagnosis not present

## 2016-10-31 LAB — HM DIABETES EYE EXAM

## 2016-11-01 DIAGNOSIS — E1165 Type 2 diabetes mellitus with hyperglycemia: Secondary | ICD-10-CM | POA: Diagnosis not present

## 2016-11-06 ENCOUNTER — Other Ambulatory Visit: Payer: Self-pay | Admitting: "Endocrinology

## 2016-11-09 ENCOUNTER — Telehealth: Payer: Self-pay

## 2016-11-09 NOTE — Telephone Encounter (Signed)
Call to encourage the patient to schedule AWV with [facility] on Tuesday or Thursday. To call the [facility] office

## 2016-11-15 ENCOUNTER — Other Ambulatory Visit: Payer: Self-pay | Admitting: Internal Medicine

## 2016-11-17 ENCOUNTER — Other Ambulatory Visit (HOSPITAL_COMMUNITY): Payer: Self-pay | Admitting: Adult Health Nurse Practitioner

## 2016-11-17 ENCOUNTER — Ambulatory Visit (HOSPITAL_COMMUNITY)
Admission: RE | Admit: 2016-11-17 | Discharge: 2016-11-17 | Disposition: A | Discharge status: Home / Self Care | Payer: Medicare HMO | Source: Ambulatory Visit | Attending: Adult Health Nurse Practitioner | Admitting: Adult Health Nurse Practitioner

## 2016-11-17 DIAGNOSIS — R52 Pain, unspecified: Secondary | ICD-10-CM

## 2016-11-17 DIAGNOSIS — M5136 Other intervertebral disc degeneration, lumbar region: Secondary | ICD-10-CM | POA: Insufficient documentation

## 2016-11-17 DIAGNOSIS — M545 Low back pain: Secondary | ICD-10-CM | POA: Diagnosis not present

## 2016-12-24 ENCOUNTER — Other Ambulatory Visit: Payer: Self-pay | Admitting: Internal Medicine

## 2017-01-02 DIAGNOSIS — R69 Illness, unspecified: Secondary | ICD-10-CM | POA: Diagnosis not present

## 2017-01-10 DIAGNOSIS — M19042 Primary osteoarthritis, left hand: Secondary | ICD-10-CM | POA: Diagnosis not present

## 2017-01-10 DIAGNOSIS — M1811 Unilateral primary osteoarthritis of first carpometacarpal joint, right hand: Secondary | ICD-10-CM | POA: Diagnosis not present

## 2017-01-10 DIAGNOSIS — M19041 Primary osteoarthritis, right hand: Secondary | ICD-10-CM | POA: Diagnosis not present

## 2017-01-10 DIAGNOSIS — M79641 Pain in right hand: Secondary | ICD-10-CM | POA: Diagnosis not present

## 2017-01-10 DIAGNOSIS — M180 Bilateral primary osteoarthritis of first carpometacarpal joints: Secondary | ICD-10-CM | POA: Diagnosis not present

## 2017-02-14 DIAGNOSIS — E119 Type 2 diabetes mellitus without complications: Secondary | ICD-10-CM | POA: Diagnosis not present

## 2017-02-14 DIAGNOSIS — E559 Vitamin D deficiency, unspecified: Secondary | ICD-10-CM | POA: Diagnosis not present

## 2017-02-14 DIAGNOSIS — I10 Essential (primary) hypertension: Secondary | ICD-10-CM | POA: Diagnosis not present

## 2017-02-14 DIAGNOSIS — Z79899 Other long term (current) drug therapy: Secondary | ICD-10-CM | POA: Diagnosis not present

## 2017-02-16 DIAGNOSIS — E1165 Type 2 diabetes mellitus with hyperglycemia: Secondary | ICD-10-CM | POA: Diagnosis not present

## 2017-02-16 DIAGNOSIS — E782 Mixed hyperlipidemia: Secondary | ICD-10-CM | POA: Diagnosis not present

## 2017-02-16 DIAGNOSIS — R1084 Generalized abdominal pain: Secondary | ICD-10-CM | POA: Diagnosis not present

## 2017-02-16 DIAGNOSIS — I10 Essential (primary) hypertension: Secondary | ICD-10-CM | POA: Diagnosis not present

## 2017-04-04 DIAGNOSIS — Z1231 Encounter for screening mammogram for malignant neoplasm of breast: Secondary | ICD-10-CM | POA: Diagnosis not present

## 2017-04-17 ENCOUNTER — Other Ambulatory Visit: Payer: Self-pay | Admitting: Internal Medicine

## 2017-04-18 ENCOUNTER — Other Ambulatory Visit: Payer: Self-pay | Admitting: Internal Medicine

## 2017-04-23 DIAGNOSIS — N898 Other specified noninflammatory disorders of vagina: Secondary | ICD-10-CM | POA: Diagnosis not present

## 2017-04-23 DIAGNOSIS — L02215 Cutaneous abscess of perineum: Secondary | ICD-10-CM | POA: Diagnosis not present

## 2017-04-24 DIAGNOSIS — Z85828 Personal history of other malignant neoplasm of skin: Secondary | ICD-10-CM | POA: Diagnosis not present

## 2017-04-24 DIAGNOSIS — D225 Melanocytic nevi of trunk: Secondary | ICD-10-CM | POA: Diagnosis not present

## 2017-04-24 DIAGNOSIS — D2372 Other benign neoplasm of skin of left lower limb, including hip: Secondary | ICD-10-CM | POA: Diagnosis not present

## 2017-04-24 DIAGNOSIS — R21 Rash and other nonspecific skin eruption: Secondary | ICD-10-CM | POA: Diagnosis not present

## 2017-04-24 DIAGNOSIS — L820 Inflamed seborrheic keratosis: Secondary | ICD-10-CM | POA: Diagnosis not present

## 2017-04-24 DIAGNOSIS — L821 Other seborrheic keratosis: Secondary | ICD-10-CM | POA: Diagnosis not present

## 2017-05-23 ENCOUNTER — Other Ambulatory Visit: Payer: Self-pay | Admitting: Internal Medicine

## 2017-05-25 DIAGNOSIS — R69 Illness, unspecified: Secondary | ICD-10-CM | POA: Diagnosis not present

## 2017-06-18 ENCOUNTER — Other Ambulatory Visit: Payer: Self-pay | Admitting: Internal Medicine

## 2017-06-22 DIAGNOSIS — E1165 Type 2 diabetes mellitus with hyperglycemia: Secondary | ICD-10-CM | POA: Diagnosis not present

## 2017-06-22 DIAGNOSIS — I10 Essential (primary) hypertension: Secondary | ICD-10-CM | POA: Diagnosis not present

## 2017-06-25 DIAGNOSIS — E782 Mixed hyperlipidemia: Secondary | ICD-10-CM | POA: Diagnosis not present

## 2017-06-25 DIAGNOSIS — E1165 Type 2 diabetes mellitus with hyperglycemia: Secondary | ICD-10-CM | POA: Diagnosis not present

## 2017-06-25 DIAGNOSIS — Z6825 Body mass index (BMI) 25.0-25.9, adult: Secondary | ICD-10-CM | POA: Diagnosis not present

## 2017-06-25 DIAGNOSIS — I10 Essential (primary) hypertension: Secondary | ICD-10-CM | POA: Diagnosis not present

## 2017-10-03 DIAGNOSIS — R69 Illness, unspecified: Secondary | ICD-10-CM | POA: Diagnosis not present

## 2017-10-08 ENCOUNTER — Other Ambulatory Visit (HOSPITAL_COMMUNITY): Payer: Self-pay | Admitting: Internal Medicine

## 2017-10-08 ENCOUNTER — Ambulatory Visit (HOSPITAL_COMMUNITY)
Admission: RE | Admit: 2017-10-08 | Discharge: 2017-10-08 | Disposition: A | Discharge status: Home / Self Care | Payer: Medicare HMO | Source: Ambulatory Visit | Attending: Internal Medicine | Admitting: Internal Medicine

## 2017-10-08 DIAGNOSIS — W19XXXA Unspecified fall, initial encounter: Secondary | ICD-10-CM

## 2017-10-08 DIAGNOSIS — M79671 Pain in right foot: Secondary | ICD-10-CM

## 2017-10-08 DIAGNOSIS — S99921A Unspecified injury of right foot, initial encounter: Secondary | ICD-10-CM | POA: Diagnosis not present

## 2017-10-09 DIAGNOSIS — S5002XS Contusion of left elbow, sequela: Secondary | ICD-10-CM | POA: Diagnosis not present

## 2017-10-09 DIAGNOSIS — S93601A Unspecified sprain of right foot, initial encounter: Secondary | ICD-10-CM | POA: Diagnosis not present

## 2017-10-26 DIAGNOSIS — I10 Essential (primary) hypertension: Secondary | ICD-10-CM | POA: Diagnosis not present

## 2017-10-26 DIAGNOSIS — E782 Mixed hyperlipidemia: Secondary | ICD-10-CM | POA: Diagnosis not present

## 2017-10-26 DIAGNOSIS — E1165 Type 2 diabetes mellitus with hyperglycemia: Secondary | ICD-10-CM | POA: Diagnosis not present

## 2017-10-29 DIAGNOSIS — Z0000 Encounter for general adult medical examination without abnormal findings: Secondary | ICD-10-CM | POA: Diagnosis not present

## 2017-10-29 DIAGNOSIS — R1012 Left upper quadrant pain: Secondary | ICD-10-CM | POA: Diagnosis not present

## 2017-10-29 DIAGNOSIS — Z6826 Body mass index (BMI) 26.0-26.9, adult: Secondary | ICD-10-CM | POA: Diagnosis not present

## 2017-10-29 DIAGNOSIS — E1165 Type 2 diabetes mellitus with hyperglycemia: Secondary | ICD-10-CM | POA: Diagnosis not present

## 2017-10-29 DIAGNOSIS — I10 Essential (primary) hypertension: Secondary | ICD-10-CM | POA: Diagnosis not present

## 2017-10-29 DIAGNOSIS — E785 Hyperlipidemia, unspecified: Secondary | ICD-10-CM | POA: Diagnosis not present

## 2017-10-30 ENCOUNTER — Other Ambulatory Visit (HOSPITAL_COMMUNITY): Payer: Self-pay | Admitting: Internal Medicine

## 2017-10-30 DIAGNOSIS — R1012 Left upper quadrant pain: Secondary | ICD-10-CM

## 2017-10-30 DIAGNOSIS — Z780 Asymptomatic menopausal state: Secondary | ICD-10-CM

## 2017-11-05 ENCOUNTER — Inpatient Hospital Stay (HOSPITAL_COMMUNITY)
Admission: RE | Admit: 2017-11-05 | Discharge: 2017-11-05 | Disposition: A | Discharge status: Home / Self Care | Payer: Medicare HMO | Source: Ambulatory Visit | Attending: Internal Medicine | Admitting: Internal Medicine

## 2017-11-05 ENCOUNTER — Encounter (HOSPITAL_COMMUNITY): Payer: Self-pay

## 2017-11-05 DIAGNOSIS — B001 Herpesviral vesicular dermatitis: Secondary | ICD-10-CM | POA: Diagnosis not present

## 2017-11-05 DIAGNOSIS — Z6826 Body mass index (BMI) 26.0-26.9, adult: Secondary | ICD-10-CM | POA: Diagnosis not present

## 2017-11-09 ENCOUNTER — Ambulatory Visit (HOSPITAL_COMMUNITY): Payer: Medicare HMO

## 2017-11-12 ENCOUNTER — Ambulatory Visit (HOSPITAL_COMMUNITY)
Admission: RE | Admit: 2017-11-12 | Discharge: 2017-11-12 | Disposition: A | Discharge status: Home / Self Care | Payer: Medicare HMO | Source: Ambulatory Visit | Attending: Internal Medicine | Admitting: Internal Medicine

## 2017-11-12 DIAGNOSIS — Z90710 Acquired absence of both cervix and uterus: Secondary | ICD-10-CM | POA: Diagnosis not present

## 2017-11-12 DIAGNOSIS — M79674 Pain in right toe(s): Secondary | ICD-10-CM | POA: Diagnosis not present

## 2017-11-12 DIAGNOSIS — R1012 Left upper quadrant pain: Secondary | ICD-10-CM | POA: Insufficient documentation

## 2017-11-12 DIAGNOSIS — K5730 Diverticulosis of large intestine without perforation or abscess without bleeding: Secondary | ICD-10-CM | POA: Diagnosis not present

## 2017-11-12 DIAGNOSIS — S92811A Other fracture of right foot, initial encounter for closed fracture: Secondary | ICD-10-CM | POA: Diagnosis not present

## 2017-11-12 DIAGNOSIS — I700 Atherosclerosis of aorta: Secondary | ICD-10-CM | POA: Insufficient documentation

## 2017-11-12 MED ORDER — IOPAMIDOL (ISOVUE-300) INJECTION 61%
100.0000 mL | Freq: Once | INTRAVENOUS | Status: AC | PRN
  Administered 2017-11-12: 100 mL via INTRAVENOUS

## 2017-12-24 DIAGNOSIS — S92811A Other fracture of right foot, initial encounter for closed fracture: Secondary | ICD-10-CM | POA: Diagnosis not present

## 2017-12-24 DIAGNOSIS — M79674 Pain in right toe(s): Secondary | ICD-10-CM | POA: Diagnosis not present

## 2018-01-21 DIAGNOSIS — M8700 Idiopathic aseptic necrosis of unspecified bone: Secondary | ICD-10-CM | POA: Diagnosis not present

## 2018-01-21 DIAGNOSIS — S92811A Other fracture of right foot, initial encounter for closed fracture: Secondary | ICD-10-CM | POA: Diagnosis not present

## 2018-01-21 DIAGNOSIS — M79674 Pain in right toe(s): Secondary | ICD-10-CM | POA: Diagnosis not present

## 2018-01-22 DIAGNOSIS — I10 Essential (primary) hypertension: Secondary | ICD-10-CM | POA: Diagnosis not present

## 2018-01-22 DIAGNOSIS — E1165 Type 2 diabetes mellitus with hyperglycemia: Secondary | ICD-10-CM | POA: Diagnosis not present

## 2018-01-22 DIAGNOSIS — E785 Hyperlipidemia, unspecified: Secondary | ICD-10-CM | POA: Diagnosis not present

## 2018-03-01 DIAGNOSIS — I10 Essential (primary) hypertension: Secondary | ICD-10-CM | POA: Diagnosis not present

## 2018-03-01 DIAGNOSIS — E1165 Type 2 diabetes mellitus with hyperglycemia: Secondary | ICD-10-CM | POA: Diagnosis not present

## 2018-03-01 DIAGNOSIS — M79671 Pain in right foot: Secondary | ICD-10-CM | POA: Diagnosis not present

## 2018-03-01 DIAGNOSIS — E785 Hyperlipidemia, unspecified: Secondary | ICD-10-CM | POA: Diagnosis not present

## 2018-03-20 ENCOUNTER — Other Ambulatory Visit (HOSPITAL_COMMUNITY): Payer: Self-pay | Admitting: Internal Medicine

## 2018-03-20 DIAGNOSIS — Z780 Asymptomatic menopausal state: Secondary | ICD-10-CM

## 2018-03-20 DIAGNOSIS — Z1231 Encounter for screening mammogram for malignant neoplasm of breast: Secondary | ICD-10-CM

## 2018-03-22 DIAGNOSIS — M79671 Pain in right foot: Secondary | ICD-10-CM | POA: Diagnosis not present

## 2018-04-01 ENCOUNTER — Other Ambulatory Visit (HOSPITAL_COMMUNITY): Payer: Medicare HMO

## 2018-04-01 ENCOUNTER — Ambulatory Visit (HOSPITAL_COMMUNITY): Payer: Medicare HMO

## 2018-04-18 ENCOUNTER — Encounter (HOSPITAL_COMMUNITY): Payer: Self-pay

## 2018-04-18 ENCOUNTER — Ambulatory Visit (HOSPITAL_COMMUNITY): Payer: Medicare HMO

## 2018-04-18 ENCOUNTER — Other Ambulatory Visit (HOSPITAL_COMMUNITY): Payer: Medicare HMO

## 2018-04-22 DIAGNOSIS — Z1231 Encounter for screening mammogram for malignant neoplasm of breast: Secondary | ICD-10-CM | POA: Diagnosis not present

## 2018-04-22 DIAGNOSIS — Z780 Asymptomatic menopausal state: Secondary | ICD-10-CM | POA: Diagnosis not present

## 2018-05-09 DIAGNOSIS — M7741 Metatarsalgia, right foot: Secondary | ICD-10-CM | POA: Diagnosis not present

## 2018-05-09 DIAGNOSIS — M79671 Pain in right foot: Secondary | ICD-10-CM | POA: Diagnosis not present

## 2018-05-10 DIAGNOSIS — R69 Illness, unspecified: Secondary | ICD-10-CM | POA: Diagnosis not present

## 2018-05-19 ENCOUNTER — Encounter: Payer: Self-pay | Admitting: Optometry

## 2018-06-19 DIAGNOSIS — Z6826 Body mass index (BMI) 26.0-26.9, adult: Secondary | ICD-10-CM | POA: Diagnosis not present

## 2018-06-19 DIAGNOSIS — B001 Herpesviral vesicular dermatitis: Secondary | ICD-10-CM | POA: Diagnosis not present

## 2018-06-19 DIAGNOSIS — S93601A Unspecified sprain of right foot, initial encounter: Secondary | ICD-10-CM | POA: Diagnosis not present

## 2018-06-19 DIAGNOSIS — M79671 Pain in right foot: Secondary | ICD-10-CM | POA: Diagnosis not present

## 2018-06-19 DIAGNOSIS — S5002XS Contusion of left elbow, sequela: Secondary | ICD-10-CM | POA: Diagnosis not present

## 2018-06-19 DIAGNOSIS — I10 Essential (primary) hypertension: Secondary | ICD-10-CM | POA: Diagnosis not present

## 2018-06-19 DIAGNOSIS — R1012 Left upper quadrant pain: Secondary | ICD-10-CM | POA: Diagnosis not present

## 2018-06-19 DIAGNOSIS — E785 Hyperlipidemia, unspecified: Secondary | ICD-10-CM | POA: Diagnosis not present

## 2018-06-19 DIAGNOSIS — E1165 Type 2 diabetes mellitus with hyperglycemia: Secondary | ICD-10-CM | POA: Diagnosis not present

## 2018-06-19 DIAGNOSIS — Z0000 Encounter for general adult medical examination without abnormal findings: Secondary | ICD-10-CM | POA: Diagnosis not present

## 2018-06-21 ENCOUNTER — Ambulatory Visit: Payer: Medicare HMO | Admitting: Cardiovascular Disease

## 2018-06-25 DIAGNOSIS — D225 Melanocytic nevi of trunk: Secondary | ICD-10-CM | POA: Diagnosis not present

## 2018-06-25 DIAGNOSIS — Z85828 Personal history of other malignant neoplasm of skin: Secondary | ICD-10-CM | POA: Diagnosis not present

## 2018-06-25 DIAGNOSIS — L570 Actinic keratosis: Secondary | ICD-10-CM | POA: Diagnosis not present

## 2018-06-25 DIAGNOSIS — L820 Inflamed seborrheic keratosis: Secondary | ICD-10-CM | POA: Diagnosis not present

## 2018-06-25 DIAGNOSIS — L821 Other seborrheic keratosis: Secondary | ICD-10-CM | POA: Diagnosis not present

## 2018-06-25 DIAGNOSIS — D1801 Hemangioma of skin and subcutaneous tissue: Secondary | ICD-10-CM | POA: Diagnosis not present

## 2018-06-25 DIAGNOSIS — D485 Neoplasm of uncertain behavior of skin: Secondary | ICD-10-CM | POA: Diagnosis not present

## 2018-06-27 ENCOUNTER — Ambulatory Visit: Payer: Medicare HMO | Admitting: Cardiovascular Disease

## 2018-06-27 ENCOUNTER — Encounter: Payer: Self-pay | Admitting: Cardiovascular Disease

## 2018-06-27 VITALS — BP 120/78 | HR 56 | Ht 62.0 in | Wt 139.8 lb

## 2018-06-27 DIAGNOSIS — Z9189 Other specified personal risk factors, not elsewhere classified: Secondary | ICD-10-CM | POA: Diagnosis not present

## 2018-06-27 DIAGNOSIS — I10 Essential (primary) hypertension: Secondary | ICD-10-CM | POA: Diagnosis not present

## 2018-06-27 DIAGNOSIS — E782 Mixed hyperlipidemia: Secondary | ICD-10-CM | POA: Diagnosis not present

## 2018-06-27 DIAGNOSIS — E118 Type 2 diabetes mellitus with unspecified complications: Secondary | ICD-10-CM

## 2018-06-27 DIAGNOSIS — Z794 Long term (current) use of insulin: Secondary | ICD-10-CM

## 2018-06-27 NOTE — Patient Instructions (Signed)
Your physician has requested that you have an exercise tolerance test. For further information please visit [email or web address]. Please also follow instruction sheet, as given.  Dr Croitoru recommends that you follow-up with him as needed. 

## 2018-06-27 NOTE — Progress Notes (Signed)
Cardiology Office Note:    Date:  06/28/2018   ID:  [name], DOB 08/08/1946, MRN [id number]  PCP:  Celene Squibb, MD  Cardiologist:  No primary care provider on file.   Referring MD: Celene Squibb, MD   Chief Complaint  Patient presents with  . New Patient (Initial Visit)  Family history of CAD  History of Present Illness:    [name] is a 72 y.o. female with a strong family history of coronary artery disease and diabetes mellitus.  She has hyperlipidemia, hypertension and type 2 diabetes mellitus and is worried about her own risks of developing coronary artery disease.  She had premature surgical menopause after a total abdominal hysterectomy and bilateral salpingo-oophorectomy for endometriosis, when she was only 72 years old.  She has never smoked.  The patient specifically denies any chest pain at rest exertion, dyspnea at rest or with exertion, orthopnea, paroxysmal nocturnal dyspnea, syncope, palpitations, focal neurological deficits, intermittent claudication, lower extremity edema, unexplained weight gain, cough, hemoptysis or wheezing.  She has never had a stress test.  Her husband [name] is also my patient.  Past Medical History:  Diagnosis Date  . Anemia   . Arthritis   . Diabetes mellitus   . Diverticulosis   . GERD (gastroesophageal reflux disease)   . Hyperlipidemia   . Hypertension   . IBS (irritable bowel syndrome)   . Kidney stones     Past Surgical History:  Procedure Laterality Date  . ABDOMINAL EXPLORATION SURGERY  1970  . ABDOMINAL HYSTERECTOMY    . BREAST LUMPECTOMY    . CYSTOCELE REPAIR    . skin graph      Current Medications: Current Meds  Medication Sig  . ACCU-CHEK AVIVA PLUS test strip   . amLODipine (NORVASC) 5 MG tablet TAKE 1 TABLET BY MOUTH DAILY  . cholecalciferol (VITAMIN D) 1000 UNITS tablet Take 1,000 Units by mouth daily.  Marland Kitchen FARXIGA 10 MG TABS tablet TK 1 T PO QD FOR BLOOD SUGAR  . Lancets (ACCU-CHEK MULTICLIX) lancets    . metFORMIN (GLUCOPHAGE) 500 MG tablet Take 500 mg by mouth 2 (two) times daily.   . metoprolol (LOPRESSOR) 100 MG tablet Take 1 tablet (100 mg total) by mouth daily.  . Omega-3 Fatty Acids (FISH OIL PO) Take 90 mg by mouth.  . simvastatin (ZOCOR) 40 MG tablet Take one half tablet by mouth daily.  . TRESIBA FLEXTOUCH 200 UNIT/ML SOPN INJECT 40 TO 50 UNITS INTO THE SKIN EVERY NIGHT AT BEDTIME  . valACYclovir (VALTREX) 1000 MG tablet valacyclovir 1 gram tablet  . valsartan-hydrochlorothiazide (DIOVAN-HCT) 320-12.5 MG tablet TAKE 1 TABLET BY MOUTH EVERY DAY  . vitamin C (ASCORBIC ACID) 500 MG tablet Take 500 mg by mouth daily. 2 by mouth twice a day  . [DISCONTINUED] Omega-3 Fatty Acids (OMEGA 3 PO) Take by mouth.     Allergies:   Amoxicillin   Social History   Socioeconomic History  . Marital status: Married    Spouse name: Not on file  . Number of children: Not on file  . Years of education: Not on file  . Highest education level: Not on file  Occupational History  . Occupation: nurse  Social Needs  . Financial resource strain: Not on file  . Food insecurity:    Worry: Not on file    Inability: Not on file  . Transportation needs:    Medical: Not on file    Non-medical: Not on file  Tobacco Use  . Smoking status: Never Smoker  . Smokeless tobacco: Never Used  Substance and Sexual Activity  . Alcohol use: No  . Drug use: No  . Sexual activity: Yes    Birth control/protection: Post-menopausal  Lifestyle  . Physical activity:    Days per week: Not on file    Minutes per session: Not on file  . Stress: Not on file  Relationships  . Social connections:    Talks on phone: Not on file    Gets together: Not on file    Attends religious service: Not on file    Active member of club or organization: Not on file    Attends meetings of clubs or organizations: Not on file    Relationship status: Not on file  Other Topics Concern  . Not on file  Social History Narrative  .  Not on file     Family History: The patient's family history includes Cancer in her mother; Diabetes in her father and mother; Heart disease in her brother and father; Liver cancer in her mother; Ovarian cancer in her mother. There is no history of Early death, Hyperlipidemia, Hypertension, Kidney disease, Stroke, Alcohol abuse, or Arthritis.  ROS:   Please see the history of present illness.    All other systems reviewed and are negative.  EKGs/Labs/Other Studies Reviewed:    EKG:  EKG is ordered today.  The ekg ordered today demonstrates sinus bradycardia, isolated T wave inversion in lead aVL.  QTc 405 ms.  Recent Labs: No results found for requested labs within last 8760 hours.  Recent Lipid Panel    Component Value Date/Time   CHOL 165 02/15/2016 1542   TRIG 227.0 (H) 02/15/2016 1542   HDL 45.90 02/15/2016 1542   CHOLHDL 4 02/15/2016 1542   VLDL 45.4 (H) 02/15/2016 1542   LDLDIRECT 85.0 02/15/2016 1542    Physical Exam:    VS:  BP 120/78 (BP Location: Left Arm, Patient Position: Sitting)   Pulse (!) 56   Ht 5\' 2"  (1.575 m)   Wt 139 lb 12.8 oz (63.4 kg)   BMI 25.57 kg/m     Wt Readings from Last 3 Encounters:  06/27/18 139 lb 12.8 oz (63.4 kg)  02/15/16 139 lb (63 kg)  12/11/15 140 lb (63.5 kg)     GEN: Well nourished, well developed in no acute distress HEENT: Normal NECK: No JVD; No carotid bruits LYMPHATICS: No lymphadenopathy CARDIAC: RRR, no murmurs, rubs, gallops RESPIRATORY:  Clear to auscultation without rales, wheezing or rhonchi  ABDOMEN: Soft, non-tender, non-distended MUSCULOSKELETAL:  No edema; No deformity  SKIN: Warm and dry NEUROLOGIC:  Alert and oriented x 3 PSYCHIATRIC:  Normal affect   ASSESSMENT:    1. At risk for coronary artery disease   2. Type 2 diabetes mellitus with complication, with long-term current use of insulin ([place])   3. Essential hypertension   4. Mixed hyperlipidemia    PLAN:    In order of problems listed  above:  1. Multiple coronary risk factors: I think is perfectly reasonable for her to have an evaluation for coronary disease with a functional study.  Her baseline ECG is almost completely normal and I think a plain treadmill stress test would be a good first step.  Reminded her not to take her beta-blocker on the morning of the test.  I do not think a calcium score makes much sense.  Since she has diabetes mellitus we need to aggressively treat her lipid  profile.  She is already taking a statin. 2. DM: I am pleased that she is taking an SGLT2 inhibitor for her diabetes.  She had some problems with Invokana and is now taking farxiga.  Reviewed the fact that these medications improve cardiovascular outcomes and type 2 diabetes. 3. HTN: She reports good control.  One of her antihypertensive medications is valsartan which should help protect from renal dysfunction with diabetes mellitus. 4. HLP: Based on current information about her health a target LDL cholesterol less than 100 is acceptable.  If doing her investigations significant vascular disease including coronary disease is discovered, then the target LDL should be less than 70.  This would require switching to a more potent statin (avoid increasing simvastatin since she is also on amlodipine).   Medication Adjustments/Labs and Tests Ordered: Current medicines are reviewed at length with the patient today.  Concerns regarding medicines are outlined above.  Orders Placed This Encounter  Procedures  . EXERCISE TOLERANCE TEST (ETT)  . EKG 12-Lead   No orders of the defined types were placed in this encounter.   Patient Instructions  Your physician has requested that you have an exercise tolerance test. For further information please visit [email or web address]. Please also follow instruction sheet, as given.  Dr Sallyanne Kuster recommends that you follow-up with him as needed.    Signed, Sanda Klein, MD  06/28/2018 3:59 PM    [place]

## 2018-06-28 ENCOUNTER — Encounter: Payer: Self-pay | Admitting: Cardiovascular Disease

## 2018-07-01 DIAGNOSIS — E782 Mixed hyperlipidemia: Secondary | ICD-10-CM | POA: Diagnosis not present

## 2018-07-01 DIAGNOSIS — Z6825 Body mass index (BMI) 25.0-25.9, adult: Secondary | ICD-10-CM | POA: Diagnosis not present

## 2018-07-01 DIAGNOSIS — I10 Essential (primary) hypertension: Secondary | ICD-10-CM | POA: Diagnosis not present

## 2018-07-01 DIAGNOSIS — E1165 Type 2 diabetes mellitus with hyperglycemia: Secondary | ICD-10-CM | POA: Diagnosis not present

## 2018-07-09 ENCOUNTER — Telehealth (HOSPITAL_COMMUNITY): Payer: Self-pay

## 2018-07-09 NOTE — Telephone Encounter (Signed)
Encounter complete. 

## 2018-07-10 ENCOUNTER — Telehealth (HOSPITAL_COMMUNITY): Payer: Self-pay

## 2018-07-10 NOTE — Telephone Encounter (Signed)
Encounter complete. 

## 2018-07-11 ENCOUNTER — Ambulatory Visit (HOSPITAL_COMMUNITY)
Admission: RE | Admit: 2018-07-11 | Discharge: 2018-07-11 | Disposition: A | Discharge status: Home / Self Care | Payer: Medicare HMO | Source: Ambulatory Visit | Attending: Cardiology | Admitting: Cardiology

## 2018-07-11 DIAGNOSIS — E782 Mixed hyperlipidemia: Secondary | ICD-10-CM | POA: Diagnosis not present

## 2018-07-11 DIAGNOSIS — Z9189 Other specified personal risk factors, not elsewhere classified: Secondary | ICD-10-CM | POA: Insufficient documentation

## 2018-07-11 DIAGNOSIS — Z794 Long term (current) use of insulin: Secondary | ICD-10-CM | POA: Insufficient documentation

## 2018-07-11 DIAGNOSIS — E118 Type 2 diabetes mellitus with unspecified complications: Secondary | ICD-10-CM | POA: Insufficient documentation

## 2018-07-12 LAB — EXERCISE TOLERANCE TEST
CHL CUP MPHR: 146 {beats}/min
Estimated workload: 7.8 METS
Exercise duration (min): 6 min
Exercise duration (sec): 33 s
Peak HR: 144 {beats}/min
Percent HR: 98 %
RPE: 19
Rest HR: 78 {beats}/min

## 2018-07-30 DIAGNOSIS — Z85828 Personal history of other malignant neoplasm of skin: Secondary | ICD-10-CM | POA: Diagnosis not present

## 2018-07-30 DIAGNOSIS — L570 Actinic keratosis: Secondary | ICD-10-CM | POA: Diagnosis not present

## 2018-07-30 DIAGNOSIS — L820 Inflamed seborrheic keratosis: Secondary | ICD-10-CM | POA: Diagnosis not present

## 2018-08-15 DIAGNOSIS — M545 Low back pain: Secondary | ICD-10-CM | POA: Diagnosis not present

## 2018-08-15 DIAGNOSIS — Z87442 Personal history of urinary calculi: Secondary | ICD-10-CM | POA: Diagnosis not present

## 2018-09-27 DIAGNOSIS — R69 Illness, unspecified: Secondary | ICD-10-CM | POA: Diagnosis not present

## 2018-10-25 DIAGNOSIS — H35372 Puckering of macula, left eye: Secondary | ICD-10-CM | POA: Diagnosis not present

## 2018-10-25 DIAGNOSIS — H25813 Combined forms of age-related cataract, bilateral: Secondary | ICD-10-CM | POA: Diagnosis not present

## 2018-10-25 DIAGNOSIS — H04123 Dry eye syndrome of bilateral lacrimal glands: Secondary | ICD-10-CM | POA: Diagnosis not present

## 2018-10-25 DIAGNOSIS — M3501 Sicca syndrome with keratoconjunctivitis: Secondary | ICD-10-CM | POA: Diagnosis not present

## 2018-10-28 DIAGNOSIS — Z6825 Body mass index (BMI) 25.0-25.9, adult: Secondary | ICD-10-CM | POA: Diagnosis not present

## 2018-10-28 DIAGNOSIS — Z0000 Encounter for general adult medical examination without abnormal findings: Secondary | ICD-10-CM | POA: Diagnosis not present

## 2018-10-28 DIAGNOSIS — S93601A Unspecified sprain of right foot, initial encounter: Secondary | ICD-10-CM | POA: Diagnosis not present

## 2018-10-28 DIAGNOSIS — B001 Herpesviral vesicular dermatitis: Secondary | ICD-10-CM | POA: Diagnosis not present

## 2018-10-28 DIAGNOSIS — R1012 Left upper quadrant pain: Secondary | ICD-10-CM | POA: Diagnosis not present

## 2018-10-28 DIAGNOSIS — S5002XS Contusion of left elbow, sequela: Secondary | ICD-10-CM | POA: Diagnosis not present

## 2018-10-28 DIAGNOSIS — M79671 Pain in right foot: Secondary | ICD-10-CM | POA: Diagnosis not present

## 2018-10-28 DIAGNOSIS — E782 Mixed hyperlipidemia: Secondary | ICD-10-CM | POA: Diagnosis not present

## 2018-10-28 DIAGNOSIS — Z6826 Body mass index (BMI) 26.0-26.9, adult: Secondary | ICD-10-CM | POA: Diagnosis not present

## 2018-10-28 DIAGNOSIS — E1165 Type 2 diabetes mellitus with hyperglycemia: Secondary | ICD-10-CM | POA: Diagnosis not present

## 2018-11-01 DIAGNOSIS — Z0000 Encounter for general adult medical examination without abnormal findings: Secondary | ICD-10-CM | POA: Diagnosis not present

## 2018-11-01 DIAGNOSIS — E782 Mixed hyperlipidemia: Secondary | ICD-10-CM | POA: Diagnosis not present

## 2018-11-01 DIAGNOSIS — E1165 Type 2 diabetes mellitus with hyperglycemia: Secondary | ICD-10-CM | POA: Diagnosis not present

## 2018-11-01 DIAGNOSIS — I10 Essential (primary) hypertension: Secondary | ICD-10-CM | POA: Diagnosis not present

## 2018-11-06 DIAGNOSIS — R69 Illness, unspecified: Secondary | ICD-10-CM | POA: Diagnosis not present

## 2018-11-08 DIAGNOSIS — R69 Illness, unspecified: Secondary | ICD-10-CM | POA: Diagnosis not present

## 2018-12-20 DIAGNOSIS — D1801 Hemangioma of skin and subcutaneous tissue: Secondary | ICD-10-CM | POA: Diagnosis not present

## 2018-12-20 DIAGNOSIS — Z85828 Personal history of other malignant neoplasm of skin: Secondary | ICD-10-CM | POA: Diagnosis not present

## 2018-12-20 DIAGNOSIS — L821 Other seborrheic keratosis: Secondary | ICD-10-CM | POA: Diagnosis not present

## 2019-01-20 DIAGNOSIS — R1012 Left upper quadrant pain: Secondary | ICD-10-CM | POA: Diagnosis not present

## 2019-01-20 DIAGNOSIS — M25552 Pain in left hip: Secondary | ICD-10-CM | POA: Diagnosis not present

## 2019-01-21 ENCOUNTER — Other Ambulatory Visit (HOSPITAL_COMMUNITY): Payer: Self-pay | Admitting: Adult Health Nurse Practitioner

## 2019-01-21 DIAGNOSIS — R1012 Left upper quadrant pain: Secondary | ICD-10-CM

## 2019-01-24 ENCOUNTER — Encounter (HOSPITAL_COMMUNITY)
Admission: RE | Admit: 2019-01-24 | Discharge: 2019-01-24 | Disposition: A | Discharge status: Home / Self Care | Payer: Medicare HMO | Source: Ambulatory Visit | Attending: Adult Health Nurse Practitioner | Admitting: Adult Health Nurse Practitioner

## 2019-01-24 ENCOUNTER — Encounter (HOSPITAL_COMMUNITY): Payer: Self-pay

## 2019-01-24 DIAGNOSIS — R109 Unspecified abdominal pain: Secondary | ICD-10-CM | POA: Diagnosis not present

## 2019-01-24 DIAGNOSIS — R1012 Left upper quadrant pain: Secondary | ICD-10-CM | POA: Diagnosis present

## 2019-01-24 DIAGNOSIS — R110 Nausea: Secondary | ICD-10-CM | POA: Diagnosis not present

## 2019-01-24 MED ORDER — TECHNETIUM TC 99M MEBROFENIN IV KIT
5.0000 | PACK | Freq: Once | INTRAVENOUS | Status: AC | PRN
  Administered 2019-01-24: 5.34 via INTRAVENOUS

## 2019-02-11 ENCOUNTER — Ambulatory Visit (INDEPENDENT_AMBULATORY_CARE_PROVIDER_SITE_OTHER): Payer: Medicare HMO | Admitting: Internal Medicine

## 2019-02-11 ENCOUNTER — Encounter (INDEPENDENT_AMBULATORY_CARE_PROVIDER_SITE_OTHER): Payer: Self-pay | Admitting: Internal Medicine

## 2019-02-11 DIAGNOSIS — R1012 Left upper quadrant pain: Secondary | ICD-10-CM | POA: Diagnosis not present

## 2019-02-11 NOTE — Patient Instructions (Signed)
Ct abdomen/pelvis with CM.

## 2019-02-11 NOTE — Progress Notes (Signed)
Subjective:    Patient ID: [name], female    DOB: 1946-01-04, 73 y.o.   MRN: [id number]  HPI Referred by Dr. [place] Crane for abnormal HIDA scan.  Underwent a HIDA scan 01/24/2019 for abdominal pain/nausea which revealed: IMPRESSION: Ejection fraction of radiotracer from the gallbladder is within normal limits at 36%, although toward the lower limits of normal with normal greater than 33% using the oral agent. The patient experience mild pain with the oral Ensure consumption. Cystic and common bile ducts are patent as is evidenced by visualization of gallbladder and small bowel. She tells me she has left upper quadrant pain. She says today is a good day. Sometimes she does have RUQ.  She says she has tenderness LUQ pain when she touches it.  Had been taking Omeprazole which did not help.  Really does not have GERD.  She says she has had this pain for years. Pain can be worse on bending over (movement). Her appetite is good. No weight loss.   10/29/2018 total bili 0.3, ALP 77, AST 19, ALT 20. Review of Systems Past Medical History:  Diagnosis Date  . Anemia   . Arthritis   . Diabetes mellitus   . Diverticulosis   . GERD (gastroesophageal reflux disease)   . Hyperlipidemia   . Hypertension   . IBS (irritable bowel syndrome)   . Kidney stones     Past Surgical History:  Procedure Laterality Date  . ABDOMINAL EXPLORATION SURGERY  1970  . ABDOMINAL HYSTERECTOMY    . BREAST LUMPECTOMY    . CYSTOCELE REPAIR    . skin graph      Allergies  Allergen Reactions  . Amoxicillin     Abdominal cramping and diarrhea    Current Outpatient Medications on File Prior to Visit  Medication Sig Dispense Refill  . ACCU-CHEK AVIVA PLUS test strip     . amLODipine (NORVASC) 5 MG tablet TAKE 1 TABLET BY MOUTH DAILY 90 tablet 1  . FARXIGA 10 MG TABS tablet TK 1 T PO QD FOR BLOOD SUGAR  1  . Lancets (ACCU-CHEK MULTICLIX) lancets     . Melatonin 10 MG CAPS Take by mouth.    . metFORMIN  (GLUCOPHAGE) 500 MG tablet Take 500 mg by mouth 2 (two) times daily.     . metoprolol (LOPRESSOR) 100 MG tablet Take 1 tablet (100 mg total) by mouth daily. 90 tablet 3  . Omega-3 Fatty Acids (FISH OIL PO) Take 90 mg by mouth.    . simvastatin (ZOCOR) 40 MG tablet Take one half tablet by mouth daily. 45 tablet 3  . TRESIBA FLEXTOUCH 200 UNIT/ML SOPN INJECT 40 TO 50 UNITS INTO THE SKIN EVERY NIGHT AT BEDTIME  5  . valACYclovir (VALTREX) 1000 MG tablet valacyclovir 1 gram tablet    . valsartan-hydrochlorothiazide (DIOVAN-HCT) 320-12.5 MG tablet TAKE 1 TABLET BY MOUTH EVERY DAY 30 tablet 0  . vitamin C (ASCORBIC ACID) 250 MG tablet Take 50 mg by mouth daily.    . vitamin C (ASCORBIC ACID) 500 MG tablet Take 500 mg by mouth daily. 2 by mouth twice a day     No current facility-administered medications on file prior to visit.         Objective:   Physical Exam Blood pressure 125/70, pulse 66, temperature 98 F (36.7 C), height 5\' 2"  (1.575 m), weight 142 lb 9.6 oz (64.7 kg). Alert and oriented. Skin warm and dry. Oral mucosa is moist.   . Sclera anicteric,  conjunctivae is pink. Thyroid not enlarged. No cervical lymphadenopathy. Lungs clear. Heart regular rate and rhythm.  Abdomen is soft. Bowel sounds are positive. No hepatomegaly. No abdominal masses felt.Tenderness (deep) LUQ.  No edema to lower extremity.         Assessment & Plan:  LUQ pain (Chronic). Pain worse with movement.  Am going to get a CT abdomen/pelvis with CM. Further recommendations to follow.

## 2019-02-20 ENCOUNTER — Telehealth (INDEPENDENT_AMBULATORY_CARE_PROVIDER_SITE_OTHER): Payer: Self-pay | Admitting: Internal Medicine

## 2019-02-20 DIAGNOSIS — R1012 Left upper quadrant pain: Secondary | ICD-10-CM

## 2019-02-20 DIAGNOSIS — R1011 Right upper quadrant pain: Secondary | ICD-10-CM

## 2019-02-20 NOTE — Telephone Encounter (Signed)
[place] sch'd 02/27/19 at 830 (815), npo after midnight, patient aware

## 2019-02-20 NOTE — Telephone Encounter (Signed)
US abdomen. I have talked with patient. She knows her insurance denied CT.

## 2019-02-20 NOTE — Telephone Encounter (Signed)
err

## 2019-02-27 ENCOUNTER — Other Ambulatory Visit: Payer: Self-pay

## 2019-02-27 ENCOUNTER — Ambulatory Visit (HOSPITAL_COMMUNITY)
Admission: RE | Admit: 2019-02-27 | Discharge: 2019-02-27 | Disposition: A | Discharge status: Home / Self Care | Payer: Medicare HMO | Source: Ambulatory Visit | Attending: Internal Medicine | Admitting: Internal Medicine

## 2019-02-27 DIAGNOSIS — R1012 Left upper quadrant pain: Secondary | ICD-10-CM | POA: Diagnosis not present

## 2019-02-27 DIAGNOSIS — R109 Unspecified abdominal pain: Secondary | ICD-10-CM | POA: Diagnosis not present

## 2019-02-27 DIAGNOSIS — R1011 Right upper quadrant pain: Secondary | ICD-10-CM | POA: Insufficient documentation

## 2019-04-02 DIAGNOSIS — B001 Herpesviral vesicular dermatitis: Secondary | ICD-10-CM | POA: Diagnosis not present

## 2019-04-02 DIAGNOSIS — E782 Mixed hyperlipidemia: Secondary | ICD-10-CM | POA: Diagnosis not present

## 2019-04-02 DIAGNOSIS — Z6826 Body mass index (BMI) 26.0-26.9, adult: Secondary | ICD-10-CM | POA: Diagnosis not present

## 2019-04-02 DIAGNOSIS — M79671 Pain in right foot: Secondary | ICD-10-CM | POA: Diagnosis not present

## 2019-04-02 DIAGNOSIS — S5002XS Contusion of left elbow, sequela: Secondary | ICD-10-CM | POA: Diagnosis not present

## 2019-04-02 DIAGNOSIS — Z6825 Body mass index (BMI) 25.0-25.9, adult: Secondary | ICD-10-CM | POA: Diagnosis not present

## 2019-04-02 DIAGNOSIS — Z0000 Encounter for general adult medical examination without abnormal findings: Secondary | ICD-10-CM | POA: Diagnosis not present

## 2019-04-02 DIAGNOSIS — S93601A Unspecified sprain of right foot, initial encounter: Secondary | ICD-10-CM | POA: Diagnosis not present

## 2019-04-02 DIAGNOSIS — R1012 Left upper quadrant pain: Secondary | ICD-10-CM | POA: Diagnosis not present

## 2019-04-02 DIAGNOSIS — E1165 Type 2 diabetes mellitus with hyperglycemia: Secondary | ICD-10-CM | POA: Diagnosis not present

## 2019-04-03 DIAGNOSIS — Z0000 Encounter for general adult medical examination without abnormal findings: Secondary | ICD-10-CM | POA: Diagnosis not present

## 2019-04-04 ENCOUNTER — Other Ambulatory Visit: Payer: Self-pay | Admitting: Internal Medicine

## 2019-04-04 DIAGNOSIS — E2839 Other primary ovarian failure: Secondary | ICD-10-CM

## 2019-04-09 DIAGNOSIS — R1012 Left upper quadrant pain: Secondary | ICD-10-CM | POA: Diagnosis not present

## 2019-04-09 DIAGNOSIS — E1165 Type 2 diabetes mellitus with hyperglycemia: Secondary | ICD-10-CM | POA: Diagnosis not present

## 2019-04-09 DIAGNOSIS — D72829 Elevated white blood cell count, unspecified: Secondary | ICD-10-CM | POA: Diagnosis not present

## 2019-04-09 DIAGNOSIS — I10 Essential (primary) hypertension: Secondary | ICD-10-CM | POA: Diagnosis not present

## 2019-04-09 DIAGNOSIS — E785 Hyperlipidemia, unspecified: Secondary | ICD-10-CM | POA: Diagnosis not present

## 2019-04-09 DIAGNOSIS — E875 Hyperkalemia: Secondary | ICD-10-CM | POA: Diagnosis not present

## 2019-04-28 DIAGNOSIS — R1030 Lower abdominal pain, unspecified: Secondary | ICD-10-CM | POA: Diagnosis not present

## 2019-04-28 DIAGNOSIS — B379 Candidiasis, unspecified: Secondary | ICD-10-CM | POA: Diagnosis not present

## 2019-04-29 ENCOUNTER — Other Ambulatory Visit: Payer: Self-pay | Admitting: Internal Medicine

## 2019-04-29 DIAGNOSIS — R1030 Lower abdominal pain, unspecified: Secondary | ICD-10-CM

## 2019-05-01 ENCOUNTER — Ambulatory Visit (HOSPITAL_COMMUNITY)
Admission: RE | Admit: 2019-05-01 | Discharge: 2019-05-01 | Disposition: A | Discharge status: Home / Self Care | Payer: Medicare HMO | Source: Ambulatory Visit | Attending: Internal Medicine | Admitting: Internal Medicine

## 2019-05-01 ENCOUNTER — Other Ambulatory Visit: Payer: Self-pay

## 2019-05-01 DIAGNOSIS — K4090 Unilateral inguinal hernia, without obstruction or gangrene, not specified as recurrent: Secondary | ICD-10-CM | POA: Diagnosis not present

## 2019-05-01 DIAGNOSIS — R1030 Lower abdominal pain, unspecified: Secondary | ICD-10-CM | POA: Diagnosis not present

## 2019-05-01 DIAGNOSIS — R1032 Left lower quadrant pain: Secondary | ICD-10-CM | POA: Diagnosis not present

## 2019-05-29 DIAGNOSIS — K4090 Unilateral inguinal hernia, without obstruction or gangrene, not specified as recurrent: Secondary | ICD-10-CM | POA: Diagnosis not present

## 2019-06-10 ENCOUNTER — Other Ambulatory Visit: Payer: Self-pay | Admitting: Surgery

## 2019-06-10 DIAGNOSIS — K4090 Unilateral inguinal hernia, without obstruction or gangrene, not specified as recurrent: Secondary | ICD-10-CM

## 2019-06-18 DIAGNOSIS — R69 Illness, unspecified: Secondary | ICD-10-CM | POA: Diagnosis not present

## 2019-06-20 DIAGNOSIS — R69 Illness, unspecified: Secondary | ICD-10-CM | POA: Diagnosis not present

## 2019-06-24 ENCOUNTER — Ambulatory Visit
Admission: RE | Admit: 2019-06-24 | Discharge: 2019-06-24 | Disposition: A | Discharge status: Home / Self Care | Payer: Medicare HMO | Source: Ambulatory Visit | Attending: Surgery | Admitting: Surgery

## 2019-06-24 DIAGNOSIS — R102 Pelvic and perineal pain: Secondary | ICD-10-CM | POA: Diagnosis not present

## 2019-06-24 DIAGNOSIS — K4090 Unilateral inguinal hernia, without obstruction or gangrene, not specified as recurrent: Secondary | ICD-10-CM

## 2019-06-24 MED ORDER — IOPAMIDOL (ISOVUE-300) INJECTION 61%
100.0000 mL | Freq: Once | INTRAVENOUS | Status: AC | PRN
  Administered 2019-06-24: 100 mL via INTRAVENOUS

## 2019-06-25 ENCOUNTER — Telehealth: Payer: Self-pay | Admitting: Surgery

## 2019-06-25 NOTE — Telephone Encounter (Signed)
Discussed CT results with her. No hernia. Discussed other possible causes of inguinodynia, possible PT referral. She will think about it.

## 2019-07-03 DIAGNOSIS — Z1231 Encounter for screening mammogram for malignant neoplasm of breast: Secondary | ICD-10-CM | POA: Diagnosis not present

## 2019-07-07 DIAGNOSIS — Z1211 Encounter for screening for malignant neoplasm of colon: Secondary | ICD-10-CM | POA: Diagnosis not present

## 2019-07-30 DIAGNOSIS — M79671 Pain in right foot: Secondary | ICD-10-CM | POA: Diagnosis not present

## 2019-07-30 DIAGNOSIS — E785 Hyperlipidemia, unspecified: Secondary | ICD-10-CM | POA: Diagnosis not present

## 2019-07-30 DIAGNOSIS — E1165 Type 2 diabetes mellitus with hyperglycemia: Secondary | ICD-10-CM | POA: Diagnosis not present

## 2019-07-30 DIAGNOSIS — I10 Essential (primary) hypertension: Secondary | ICD-10-CM | POA: Diagnosis not present

## 2019-07-30 DIAGNOSIS — B001 Herpesviral vesicular dermatitis: Secondary | ICD-10-CM | POA: Diagnosis not present

## 2019-08-01 DIAGNOSIS — R69 Illness, unspecified: Secondary | ICD-10-CM | POA: Diagnosis not present

## 2019-08-11 DIAGNOSIS — M1612 Unilateral primary osteoarthritis, left hip: Secondary | ICD-10-CM | POA: Diagnosis not present

## 2019-08-11 DIAGNOSIS — M76899 Other specified enthesopathies of unspecified lower limb, excluding foot: Secondary | ICD-10-CM | POA: Diagnosis not present

## 2019-08-13 DIAGNOSIS — M25552 Pain in left hip: Secondary | ICD-10-CM | POA: Diagnosis not present

## 2019-08-13 DIAGNOSIS — R1030 Lower abdominal pain, unspecified: Secondary | ICD-10-CM | POA: Diagnosis not present

## 2019-08-13 DIAGNOSIS — K5900 Constipation, unspecified: Secondary | ICD-10-CM | POA: Diagnosis not present

## 2019-08-19 DIAGNOSIS — M79671 Pain in right foot: Secondary | ICD-10-CM | POA: Diagnosis not present

## 2019-08-19 DIAGNOSIS — B001 Herpesviral vesicular dermatitis: Secondary | ICD-10-CM | POA: Diagnosis not present

## 2019-08-19 DIAGNOSIS — I10 Essential (primary) hypertension: Secondary | ICD-10-CM | POA: Diagnosis not present

## 2019-08-19 DIAGNOSIS — E785 Hyperlipidemia, unspecified: Secondary | ICD-10-CM | POA: Diagnosis not present

## 2019-08-19 DIAGNOSIS — E1165 Type 2 diabetes mellitus with hyperglycemia: Secondary | ICD-10-CM | POA: Diagnosis not present

## 2019-08-20 DIAGNOSIS — M25552 Pain in left hip: Secondary | ICD-10-CM | POA: Diagnosis not present

## 2019-09-01 DIAGNOSIS — E782 Mixed hyperlipidemia: Secondary | ICD-10-CM | POA: Diagnosis not present

## 2019-09-01 DIAGNOSIS — E785 Hyperlipidemia, unspecified: Secondary | ICD-10-CM | POA: Diagnosis not present

## 2019-09-01 DIAGNOSIS — I10 Essential (primary) hypertension: Secondary | ICD-10-CM | POA: Diagnosis not present

## 2019-09-01 DIAGNOSIS — E1165 Type 2 diabetes mellitus with hyperglycemia: Secondary | ICD-10-CM | POA: Diagnosis not present

## 2019-09-02 DIAGNOSIS — M25552 Pain in left hip: Secondary | ICD-10-CM | POA: Diagnosis not present

## 2019-09-02 DIAGNOSIS — M1612 Unilateral primary osteoarthritis, left hip: Secondary | ICD-10-CM | POA: Diagnosis not present

## 2019-09-03 DIAGNOSIS — E875 Hyperkalemia: Secondary | ICD-10-CM | POA: Diagnosis not present

## 2019-09-03 DIAGNOSIS — E1165 Type 2 diabetes mellitus with hyperglycemia: Secondary | ICD-10-CM | POA: Diagnosis not present

## 2019-09-03 DIAGNOSIS — I10 Essential (primary) hypertension: Secondary | ICD-10-CM | POA: Diagnosis not present

## 2019-09-03 DIAGNOSIS — E782 Mixed hyperlipidemia: Secondary | ICD-10-CM | POA: Diagnosis not present

## 2019-09-05 DIAGNOSIS — L821 Other seborrheic keratosis: Secondary | ICD-10-CM | POA: Diagnosis not present

## 2019-09-05 DIAGNOSIS — D1801 Hemangioma of skin and subcutaneous tissue: Secondary | ICD-10-CM | POA: Diagnosis not present

## 2019-09-05 DIAGNOSIS — Z85828 Personal history of other malignant neoplasm of skin: Secondary | ICD-10-CM | POA: Diagnosis not present

## 2019-09-05 DIAGNOSIS — D225 Melanocytic nevi of trunk: Secondary | ICD-10-CM | POA: Diagnosis not present

## 2019-09-05 DIAGNOSIS — L820 Inflamed seborrheic keratosis: Secondary | ICD-10-CM | POA: Diagnosis not present

## 2019-09-09 DIAGNOSIS — E782 Mixed hyperlipidemia: Secondary | ICD-10-CM | POA: Diagnosis not present

## 2019-09-09 DIAGNOSIS — S5002XS Contusion of left elbow, sequela: Secondary | ICD-10-CM | POA: Diagnosis not present

## 2019-09-09 DIAGNOSIS — B379 Candidiasis, unspecified: Secondary | ICD-10-CM | POA: Diagnosis not present

## 2019-09-09 DIAGNOSIS — Z6825 Body mass index (BMI) 25.0-25.9, adult: Secondary | ICD-10-CM | POA: Diagnosis not present

## 2019-09-09 DIAGNOSIS — R1012 Left upper quadrant pain: Secondary | ICD-10-CM | POA: Diagnosis not present

## 2019-09-09 DIAGNOSIS — B001 Herpesviral vesicular dermatitis: Secondary | ICD-10-CM | POA: Diagnosis not present

## 2019-09-09 DIAGNOSIS — D72829 Elevated white blood cell count, unspecified: Secondary | ICD-10-CM | POA: Diagnosis not present

## 2019-09-09 DIAGNOSIS — M79671 Pain in right foot: Secondary | ICD-10-CM | POA: Diagnosis not present

## 2019-09-09 DIAGNOSIS — R1030 Lower abdominal pain, unspecified: Secondary | ICD-10-CM | POA: Diagnosis not present

## 2019-09-09 DIAGNOSIS — S93601A Unspecified sprain of right foot, initial encounter: Secondary | ICD-10-CM | POA: Diagnosis not present

## 2019-09-11 DIAGNOSIS — Z6826 Body mass index (BMI) 26.0-26.9, adult: Secondary | ICD-10-CM | POA: Diagnosis not present

## 2019-09-11 DIAGNOSIS — E785 Hyperlipidemia, unspecified: Secondary | ICD-10-CM | POA: Diagnosis not present

## 2019-09-11 DIAGNOSIS — S5002XS Contusion of left elbow, sequela: Secondary | ICD-10-CM | POA: Diagnosis not present

## 2019-09-11 DIAGNOSIS — S93601A Unspecified sprain of right foot, initial encounter: Secondary | ICD-10-CM | POA: Diagnosis not present

## 2019-09-11 DIAGNOSIS — B001 Herpesviral vesicular dermatitis: Secondary | ICD-10-CM | POA: Diagnosis not present

## 2019-09-11 DIAGNOSIS — I10 Essential (primary) hypertension: Secondary | ICD-10-CM | POA: Diagnosis not present

## 2019-09-11 DIAGNOSIS — E1165 Type 2 diabetes mellitus with hyperglycemia: Secondary | ICD-10-CM | POA: Diagnosis not present

## 2019-09-11 DIAGNOSIS — R1012 Left upper quadrant pain: Secondary | ICD-10-CM | POA: Diagnosis not present

## 2019-09-11 DIAGNOSIS — M79671 Pain in right foot: Secondary | ICD-10-CM | POA: Diagnosis not present

## 2019-09-11 DIAGNOSIS — Z0000 Encounter for general adult medical examination without abnormal findings: Secondary | ICD-10-CM | POA: Diagnosis not present

## 2019-09-22 DIAGNOSIS — M160 Bilateral primary osteoarthritis of hip: Secondary | ICD-10-CM | POA: Diagnosis not present

## 2019-09-22 DIAGNOSIS — M24152 Other articular cartilage disorders, left hip: Secondary | ICD-10-CM | POA: Diagnosis not present

## 2019-09-22 DIAGNOSIS — M67952 Unspecified disorder of synovium and tendon, left thigh: Secondary | ICD-10-CM | POA: Diagnosis not present

## 2019-09-22 DIAGNOSIS — R69 Illness, unspecified: Secondary | ICD-10-CM | POA: Diagnosis not present

## 2019-09-26 DIAGNOSIS — M7062 Trochanteric bursitis, left hip: Secondary | ICD-10-CM | POA: Diagnosis not present

## 2019-09-26 DIAGNOSIS — M545 Low back pain: Secondary | ICD-10-CM | POA: Diagnosis not present

## 2019-09-26 DIAGNOSIS — M1612 Unilateral primary osteoarthritis, left hip: Secondary | ICD-10-CM | POA: Diagnosis not present

## 2019-10-02 DIAGNOSIS — M545 Low back pain: Secondary | ICD-10-CM | POA: Diagnosis not present

## 2019-10-04 DIAGNOSIS — J029 Acute pharyngitis, unspecified: Secondary | ICD-10-CM | POA: Diagnosis not present

## 2019-10-04 DIAGNOSIS — J039 Acute tonsillitis, unspecified: Secondary | ICD-10-CM | POA: Diagnosis not present

## 2019-10-13 DIAGNOSIS — M545 Low back pain: Secondary | ICD-10-CM | POA: Diagnosis not present

## 2019-10-23 DIAGNOSIS — E1165 Type 2 diabetes mellitus with hyperglycemia: Secondary | ICD-10-CM | POA: Diagnosis not present

## 2019-10-23 DIAGNOSIS — E785 Hyperlipidemia, unspecified: Secondary | ICD-10-CM | POA: Diagnosis not present

## 2019-10-23 DIAGNOSIS — I10 Essential (primary) hypertension: Secondary | ICD-10-CM | POA: Diagnosis not present

## 2019-10-27 DIAGNOSIS — M545 Low back pain: Secondary | ICD-10-CM | POA: Diagnosis not present

## 2019-10-28 DIAGNOSIS — R69 Illness, unspecified: Secondary | ICD-10-CM | POA: Diagnosis not present

## 2019-11-11 DIAGNOSIS — M5136 Other intervertebral disc degeneration, lumbar region: Secondary | ICD-10-CM | POA: Diagnosis not present

## 2019-11-18 DIAGNOSIS — B009 Herpesviral infection, unspecified: Secondary | ICD-10-CM | POA: Diagnosis not present

## 2019-11-18 DIAGNOSIS — Z794 Long term (current) use of insulin: Secondary | ICD-10-CM | POA: Diagnosis not present

## 2019-11-18 DIAGNOSIS — I10 Essential (primary) hypertension: Secondary | ICD-10-CM | POA: Diagnosis not present

## 2019-11-18 DIAGNOSIS — E785 Hyperlipidemia, unspecified: Secondary | ICD-10-CM | POA: Diagnosis not present

## 2019-11-18 DIAGNOSIS — I951 Orthostatic hypotension: Secondary | ICD-10-CM | POA: Diagnosis not present

## 2019-11-18 DIAGNOSIS — M1990 Unspecified osteoarthritis, unspecified site: Secondary | ICD-10-CM | POA: Diagnosis not present

## 2019-11-18 DIAGNOSIS — E119 Type 2 diabetes mellitus without complications: Secondary | ICD-10-CM | POA: Diagnosis not present

## 2019-11-26 DIAGNOSIS — E785 Hyperlipidemia, unspecified: Secondary | ICD-10-CM | POA: Diagnosis not present

## 2019-11-26 DIAGNOSIS — E1165 Type 2 diabetes mellitus with hyperglycemia: Secondary | ICD-10-CM | POA: Diagnosis not present

## 2019-11-26 DIAGNOSIS — I10 Essential (primary) hypertension: Secondary | ICD-10-CM | POA: Diagnosis not present

## 2021-01-10 ENCOUNTER — Ambulatory Visit (HOSPITAL_COMMUNITY)
Admission: RE | Admit: 2021-01-10 | Discharge: 2021-01-10 | Disposition: A | Discharge status: Home / Self Care | Payer: Medicare HMO | Source: Ambulatory Visit | Attending: Internal Medicine | Admitting: Internal Medicine

## 2021-01-10 ENCOUNTER — Other Ambulatory Visit (HOSPITAL_COMMUNITY): Payer: Self-pay | Admitting: Internal Medicine

## 2021-01-10 ENCOUNTER — Other Ambulatory Visit: Payer: Self-pay

## 2021-01-10 DIAGNOSIS — S60922A Unspecified superficial injury of left hand, initial encounter: Secondary | ICD-10-CM

## 2021-06-08 ENCOUNTER — Other Ambulatory Visit (HOSPITAL_COMMUNITY): Payer: Self-pay | Admitting: Family Medicine

## 2021-06-08 ENCOUNTER — Other Ambulatory Visit: Payer: Self-pay

## 2021-06-08 ENCOUNTER — Ambulatory Visit (HOSPITAL_COMMUNITY)
Admission: RE | Admit: 2021-06-08 | Discharge: 2021-06-08 | Disposition: A | Discharge status: Home / Self Care | Payer: Medicare HMO | Source: Ambulatory Visit | Attending: Family Medicine | Admitting: Family Medicine

## 2021-06-08 DIAGNOSIS — M79671 Pain in right foot: Secondary | ICD-10-CM | POA: Diagnosis not present

## 2021-11-08 ENCOUNTER — Other Ambulatory Visit (HOSPITAL_COMMUNITY): Payer: Self-pay | Admitting: Internal Medicine

## 2021-11-08 ENCOUNTER — Other Ambulatory Visit: Payer: Self-pay

## 2021-11-08 ENCOUNTER — Ambulatory Visit (HOSPITAL_COMMUNITY)
Admission: RE | Admit: 2021-11-08 | Discharge: 2021-11-08 | Disposition: A | Discharge status: Home / Self Care | Payer: Medicare HMO | Source: Ambulatory Visit | Attending: Internal Medicine | Admitting: Internal Medicine

## 2021-11-08 DIAGNOSIS — R0600 Dyspnea, unspecified: Secondary | ICD-10-CM | POA: Diagnosis present

## 2022-01-31 ENCOUNTER — Other Ambulatory Visit (HOSPITAL_COMMUNITY): Payer: Self-pay | Admitting: Family Medicine

## 2022-01-31 DIAGNOSIS — N644 Mastodynia: Secondary | ICD-10-CM

## 2022-02-01 ENCOUNTER — Other Ambulatory Visit (HOSPITAL_COMMUNITY): Payer: Self-pay | Admitting: Family Medicine

## 2022-02-01 DIAGNOSIS — Z13820 Encounter for screening for osteoporosis: Secondary | ICD-10-CM

## 2022-02-06 ENCOUNTER — Inpatient Hospital Stay
Admission: RE | Admit: 2022-02-06 | Discharge: 2022-02-06 | Disposition: A | Discharge status: Home / Self Care | Payer: Self-pay | Source: Ambulatory Visit | Attending: Family Medicine | Admitting: Family Medicine

## 2022-02-06 ENCOUNTER — Ambulatory Visit
Admission: RE | Admit: 2022-02-06 | Discharge: 2022-02-06 | Disposition: A | Discharge status: Home / Self Care | Payer: Self-pay | Source: Ambulatory Visit | Attending: Family Medicine | Admitting: Family Medicine

## 2022-02-06 ENCOUNTER — Other Ambulatory Visit (HOSPITAL_COMMUNITY): Payer: Self-pay | Admitting: Family Medicine

## 2022-02-06 DIAGNOSIS — N644 Mastodynia: Secondary | ICD-10-CM

## 2022-02-20 ENCOUNTER — Ambulatory Visit (HOSPITAL_COMMUNITY)
Admission: RE | Admit: 2022-02-20 | Discharge: 2022-02-20 | Disposition: A | Discharge status: Home / Self Care | Payer: Medicare HMO | Source: Ambulatory Visit | Attending: Family Medicine | Admitting: Family Medicine

## 2022-02-20 ENCOUNTER — Ambulatory Visit (HOSPITAL_COMMUNITY): Payer: Medicare HMO

## 2022-02-20 ENCOUNTER — Other Ambulatory Visit: Payer: Self-pay

## 2022-02-20 DIAGNOSIS — E119 Type 2 diabetes mellitus without complications: Secondary | ICD-10-CM | POA: Insufficient documentation

## 2022-02-20 DIAGNOSIS — N644 Mastodynia: Secondary | ICD-10-CM | POA: Diagnosis present

## 2022-02-20 DIAGNOSIS — Z794 Long term (current) use of insulin: Secondary | ICD-10-CM | POA: Diagnosis not present

## 2022-02-20 DIAGNOSIS — Z780 Asymptomatic menopausal state: Secondary | ICD-10-CM | POA: Insufficient documentation

## 2022-02-20 DIAGNOSIS — Z13820 Encounter for screening for osteoporosis: Secondary | ICD-10-CM | POA: Insufficient documentation

## 2022-02-20 DIAGNOSIS — M85832 Other specified disorders of bone density and structure, left forearm: Secondary | ICD-10-CM | POA: Insufficient documentation

## 2022-03-29 ENCOUNTER — Encounter: Payer: Self-pay | Admitting: Neurology

## 2022-03-29 ENCOUNTER — Ambulatory Visit: Payer: Medicare HMO | Admitting: Neurology

## 2022-03-29 ENCOUNTER — Telehealth: Payer: Self-pay | Admitting: *Deleted

## 2022-03-29 VITALS — BP 135/59 | HR 61 | Ht 61.0 in | Wt 135.0 lb

## 2022-03-29 DIAGNOSIS — B0229 Other postherpetic nervous system involvement: Secondary | ICD-10-CM

## 2022-03-29 DIAGNOSIS — E119 Type 2 diabetes mellitus without complications: Secondary | ICD-10-CM | POA: Diagnosis not present

## 2022-03-29 DIAGNOSIS — Z794 Long term (current) use of insulin: Secondary | ICD-10-CM | POA: Diagnosis not present

## 2022-03-29 DIAGNOSIS — R208 Other disturbances of skin sensation: Secondary | ICD-10-CM | POA: Diagnosis not present

## 2022-03-29 MED ORDER — LIDOCAINE 5 % EX OINT: 1.0000 "application " | TOPICAL_OINTMENT | CUTANEOUS | 5 refills | Status: AC | PRN

## 2022-03-29 NOTE — Telephone Encounter (Signed)
Initiated PA on CMM. Key: [id number] - PA Case ID: [id number] - Rx #: [id number]. In process of completing. ?

## 2022-03-29 NOTE — Telephone Encounter (Signed)
Submitted PA. Waiting on determination from [employer]. 

## 2022-03-29 NOTE — Patient Instructions (Signed)
Increase gabapentin as follows ?For 3 days take:  one in morning, one at dinner and two at bedtime ?For next 3 days take:  one in morning, one at dinner and two at bedtime ?Then:  2 pills three times a day ?

## 2022-03-29 NOTE — Progress Notes (Signed)
? ?[facility] ? ?PATIENT: [name] ?DOB: 21-Sep-1946 ? ?REFERRING DOCTOR OR PCP: Valentino Nose FNP ?SOURCE: Patient, notes from primary care, lab results ? ?_________________________________ ? ? ?HISTORICAL ? ?CHIEF COMPLAINT:  ?Chief Complaint  ?Patient presents with  ? Follow-up  ?  Rm 1, alone. Pt referred by Valentino Nose, NP for polyneuropathy. Pt c/o of constant R mid-back pn that comes across to her chest. Stabbing pn, skin sensitive to cloth and even showering in painful. Taking gabapentin and ibuprofen. Wonders if its shingles.   ? ? ?HISTORY OF PRESENT ILLNESS:  ?I had the pleasure of seeing your patient, [name], at [facility] for neurologic consultation regarding her polyneuropathy. ? ?She is a 77 year old woman who had the onset of pain below the right breast in January 2023.   Pain respects the midline.    Pain is stabbing and intense.     Touch and water feels like needles.   She is hypersensitive to her clothes/bra and wears a soft shirt underneath.     She started gabapentin 03/13/2022 and just takes 2 x 100 mg daily . She tolerates gabapentin 10 mg po bid and has not tried a higher dose.  She also has tried Voltaren gel without much benefit.  She takes Motrin as needed but is reluctant to increase the dose due to her diabetes ? ?She never had a rash.  She has done her shingles vaccine (actually did twice).   She was placed on Valtrex for a cold sore 2 years ago.      ? ?She has not any imaging studies.     ? ?She has type 2 IDDM that is not always controlled (last HgbA1c was 8.2).   She denies numbness or pain in her feet.   No sores.    ? ?Lab work reviewed from March 2023: Glucose was mildly elevated.  LFTs were normal.  Renal function was normal.  Electrolytes were normal.  CBC with differential normal ? ?REVIEW OF SYSTEMS: ?Constitutional: No fevers, chills, sweats, or change in appetite ?Eyes: No visual changes, double vision, eye pain ?Ear, nose and  throat: No hearing loss, ear pain, nasal congestion, sore throat ?Cardiovascular: No chest pain, palpitations ?Respiratory:  No shortness of breath at rest or with exertion.   No wheezes ?GastrointestinaI: No nausea, vomiting, diarrhea, abdominal pain, fecal incontinence ?Genitourinary:  No dysuria, urinary retention or frequency.  No nocturia. ?Musculoskeletal:  No neck pain, back pain ?Integumentary: No rash, pruritus, skin lesions ?Neurological: as above ?Psychiatric: No depression at this time.  No anxiety ?Endocrine: No palpitations, diaphoresis, change in appetite, change in weigh or increased thirst ?Hematologic/Lymphatic:  No anemia, purpura, petechiae. ?Allergic/Immunologic: No itchy/runny eyes, nasal congestion, recent allergic reactions, rashes ? ?ALLERGIES: ?Allergies  ?Allergen Reactions  ? Amoxicillin   ?  Abdominal cramping and diarrhea  ? ? ?HOME MEDICATIONS: ? ?Current Outpatient Medications:  ?  ACCU-CHEK AVIVA PLUS test strip, , Disp: , Rfl:  ?  amLODipine (NORVASC) 5 MG tablet, TAKE 1 TABLET BY MOUTH DAILY, Disp: 90 tablet, Rfl: 1 ?  gabapentin (NEURONTIN) 100 MG capsule, Take 100 mg by mouth 3 (three) times daily., Disp: , Rfl:  ?  glipiZIDE (GLUCOTROL XL) 10 MG 24 hr tablet, Take 10 mg by mouth daily., Disp: , Rfl:  ?  HUMALOG 100 UNIT/ML cartridge, , Disp: , Rfl:  ?  ibuprofen (ADVIL) 200 MG tablet, Take 200 mg by mouth every 6 (six) hours as needed., Disp: , Rfl:  ?  Lancets (ACCU-CHEK MULTICLIX) lancets, , Disp: , Rfl:  ?  Melatonin 10 MG CAPS, Take by mouth., Disp: , Rfl:  ?  metoprolol (LOPRESSOR) 100 MG tablet, Take 1 tablet (100 mg total) by mouth daily., Disp: 90 tablet, Rfl: 3 ?  Omega-3 Fatty Acids (FISH OIL PO), Take 90 mg by mouth., Disp: , Rfl:  ?  simvastatin (ZOCOR) 40 MG tablet, Take one half tablet by mouth daily., Disp: 45 tablet, Rfl: 3 ?  SYNJARDY XR 25-1000 MG TB24, Take 1 tablet by mouth daily., Disp: , Rfl:  ?  TRESIBA FLEXTOUCH 200 UNIT/ML SOPN, INJECT 40 TO 50 UNITS  INTO THE SKIN EVERY NIGHT AT BEDTIME, Disp: , Rfl: 5 ?  valACYclovir (VALTREX) 1000 MG tablet, valacyclovir 1 gram tablet, Disp: , Rfl:  ?  valsartan-hydrochlorothiazide (DIOVAN-HCT) 320-12.5 MG tablet, TAKE 1 TABLET BY MOUTH EVERY DAY, Disp: 30 tablet, Rfl: 0 ? ?PAST MEDICAL HISTORY: ?Past Medical History:  ?Diagnosis Date  ? Anemia   ? Arthritis   ? Diabetes mellitus   ? Diverticulosis   ? GERD (gastroesophageal reflux disease)   ? Hyperlipidemia   ? Hypertension   ? IBS (irritable bowel syndrome)   ? Kidney stones   ? ? ?PAST SURGICAL HISTORY: ?Past Surgical History:  ?Procedure Laterality Date  ? ABDOMINAL EXPLORATION SURGERY  1970  ? ABDOMINAL HYSTERECTOMY    ? BREAST LUMPECTOMY    ? CYSTOCELE REPAIR    ? skin graph    ? ? ?FAMILY HISTORY: ?Family History  ?Problem Relation Age of Onset  ? Liver cancer Mother   ? Ovarian cancer Mother   ? Diabetes Mother   ? Cancer Mother   ?     ovarian  ? Diabetes Father   ? Heart disease Father   ? Heart disease Brother   ? Early death Neg Hx   ? Hyperlipidemia Neg Hx   ? Hypertension Neg Hx   ? Kidney disease Neg Hx   ? Stroke Neg Hx   ? Alcohol abuse Neg Hx   ? Arthritis Neg Hx   ? ? ?SOCIAL HISTORY: ? ?Social History  ? ?Socioeconomic History  ? Marital status: Married  ?  Spouse name: Yvone Neu  ? Number of children: 1  ? Years of education: Not on file  ? Highest education level: Associate degree: occupational, Hotel manager, or vocational program  ?Occupational History  ? Occupation: nurse  ?Tobacco Use  ? Smoking status: Never  ? Smokeless tobacco: Never  ?Substance and Sexual Activity  ? Alcohol use: No  ? Drug use: No  ? Sexual activity: Yes  ?  Birth control/protection: Post-menopausal  ?Other Topics Concern  ? Not on file  ?Social History Narrative  ? Lives at home with husband  ? R handed  ? Caffeine: 2 C of coffee a day.  ? ?Social Determinants of Health  ? ?Financial Resource Strain: Not on file  ?Food Insecurity: Not on file  ?Transportation Needs: Not on file   ?Physical Activity: Not on file  ?Stress: Not on file  ?Social Connections: Not on file  ?Intimate Partner Violence: Not on file  ? ? ? ?PHYSICAL EXAM ? ?Vitals:  ? 03/29/22 1018  ?BP: (!) 135/59  ?Pulse: 61  ?Weight: 135 lb (61.2 kg)  ?Height: '5\' 1"'$  (1.549 m)  ? ? ?Body mass index is 25.51 kg/m?. ? ? ?General: The patient is well-developed and well-nourished and in no acute distress ? ?HEENT:  Head is [place]/AT.  Sclera are anicteric.  Funduscopic exam shows normal optic discs and retinal vessels. ? ?Neck: No carotid bruits are noted.  The neck is nontender. ? ?Cardiovascular: The heart has a regular rate and rhythm with a normal S1 and S2. There were no murmurs, gallops or rubs.   ? ?Skin: Extremities are without rash or  edema. ? ?Musculoskeletal:  Back is nontender ? ?Neurologic Exam ? ?Mental status: The patient is alert and oriented x 3 at the time of the examination. The patient has apparent normal recent and remote memory, with an apparently normal attention span and concentration ability.   Speech is normal. ? ?Cranial nerves: Extraocular movements are full. Pupils are equal, round, and reactive to light and accomodation.  Visual fields are full.  Facial symmetry is present. There is good facial sensation to soft touch bilaterally.Facial strength is normal.  Trapezius and sternocleidomastoid strength is normal. No dysarthria is noted.  The tongue is midline, and the patient has symmetric elevation of the soft palate. No obvious hearing deficits are noted. ? ?Motor:  Muscle bulk is normal.   Tone is normal. Strength is  5 / 5 in all 4 extremities.  ? ?Sensory: Sensory testing is intact to pinprick, soft touch and vibration sensation in all 4 extremities including toes.  However, she has allodynia in right flank to lower/under breast (T5 and/or T6 dermatome).   ? ?Coordination: Cerebellar testing reveals good finger-nose-finger and heel-to-shin bilaterally. ? ?Gait and station: Station is normal.   Gait is  normal. Tandem gait is mildly wide (normal for age). Romberg is negative.  ? ?Reflexes: Deep tendon reflexes are symmetric and normal in arms, 1 at knees absent at ankles.   Plantar responses are flexor. ? ? ? ?DIAGNOS

## 2022-03-29 NOTE — Telephone Encounter (Signed)
PA approved 12/11/2021 - 06/27/2022. ?

## 2022-04-05 ENCOUNTER — Telehealth: Payer: Self-pay | Admitting: Neurology

## 2022-04-05 NOTE — Telephone Encounter (Signed)
Pt request refill for gabapentin (NEURONTIN) 100 MG capsule at [place] #[id number] ?

## 2022-04-05 NOTE — Telephone Encounter (Signed)
Called pt. She started off taking gabapentin '100mg'$ , 1 in the am, 1 lunch, 2 at night for 3 days, then went to 1 am, 2 lunch, 2 at night x3 days THEN went to 2 am, 2 lunch, 2 bedtime. She is tolerating well. She has two refills left from script PCP write previously. She is going to use this and continue with '200mg'$  po TID for a little while longer before having [place] refill.  Wants to make sure she continues to tolerate ok. She will call back later with update.  ? ?She also takes ibuprofen '400mg'$  po qhs. ?

## 2022-04-06 ENCOUNTER — Other Ambulatory Visit: Payer: Self-pay | Admitting: Neurology

## 2022-04-06 MED ORDER — GABAPENTIN 100 MG PO CAPS: 200.0000 mg | ORAL_CAPSULE | Freq: Three times a day (TID) | ORAL | 5 refills | Status: DC

## 2022-04-06 NOTE — Telephone Encounter (Signed)
Pt calling to request [place] office refill the prescription of  gabapentin '100mg'$ . ? ?Pt states has 12 pills left which will last her 2 days. ?Pt spoke to [facility] #[id number]  today, pharmacy told pt to have Dr. Felecia Shelling refill the prescription so they know how pt will be taking the medication.  ?Would like a call back.  ?

## 2022-04-06 NOTE — Telephone Encounter (Signed)
Per last instructions the patient is taking 200 mg TID. I have forwarded the script for that strength and quantity.  ?

## 2022-04-11 ENCOUNTER — Ambulatory Visit: Payer: Medicare HMO | Admitting: Diagnostic Neuroimaging

## 2022-04-21 ENCOUNTER — Ambulatory Visit: Payer: Medicare HMO | Admitting: Neurology

## 2022-05-10 ENCOUNTER — Encounter: Payer: Self-pay | Admitting: Neurology

## 2022-05-10 ENCOUNTER — Ambulatory Visit: Payer: Medicare HMO | Admitting: Neurology

## 2022-05-10 VITALS — BP 114/53 | HR 72 | Ht 61.0 in | Wt 134.5 lb

## 2022-05-10 DIAGNOSIS — R208 Other disturbances of skin sensation: Secondary | ICD-10-CM

## 2022-05-10 DIAGNOSIS — B0229 Other postherpetic nervous system involvement: Secondary | ICD-10-CM | POA: Diagnosis not present

## 2022-05-10 DIAGNOSIS — Z79899 Other long term (current) drug therapy: Secondary | ICD-10-CM | POA: Diagnosis not present

## 2022-05-10 MED ORDER — LAMOTRIGINE 25 MG PO TABS: ORAL_TABLET | ORAL | 5 refills | Status: DC

## 2022-05-10 NOTE — Progress Notes (Signed)
[facility]  PATIENT: [name] DOB: 1946-03-23  REFERRING DOCTOR OR PCP: Valentino Nose FNP SOURCE: Patient, notes from primary care, lab results  _________________________________   HISTORICAL  CHIEF COMPLAINT:  Chief Complaint  Patient presents with   Follow-up    Rm 1, alone. Here to f/u for postherpetic neuralgia. Pt reports doing well since aking Advil '600mg'$  BID instead of gabapentin. Pt wanted to see if taking Advil long term will cause any SE.     HISTORY OF PRESENT ILLNESS:  [name], is a 76 y.o. woman with polyneuropathy.  Update 05/10/2022: She has stabbing pain in the right chest since February 2023.   Continues to have allodynia on the right.    Gabapentin was poorly tolerated so she stopped and started NSAIDs (she is taking 600 mg ibuprofen bid)   She tolerates that well ad notes some benefit.   Tylenol has helped some too   Shingles / PHN history: She is a 76 year old woman who had the onset of pain below the right breast in January 2023.   Pain respects the midline.    Pain is stabbing and intense.     Touch and water feels like needles.   She is hypersensitive to her clothes/bra and wears a soft shirt underneath.     She started gabapentin 03/13/2022 and just takes 2 x 100 mg daily . She tolerates gabapentin 10 mg po bid and has not tried a higher dose.  She also has tried Voltaren gel without much benefit.  She takes Motrin as needed but is reluctant to increase the dose due to her diabetes  She never had a rash.  She has done her shingles vaccine (actually did twice).   She was placed on Valtrex for a cold sore 2 years ago.       She has not any imaging studies.      She has type 2 IDDM that is not always controlled (last HgbA1c was 8.2).   She denies numbness or pain in her feet.   No sores.     Lab work reviewed from March 2023: Glucose was mildly elevated.  LFTs were normal.  Renal function was normal.  Electrolytes were normal.  CBC  with differential normal  REVIEW OF SYSTEMS: Constitutional: No fevers, chills, sweats, or change in appetite Eyes: No visual changes, double vision, eye pain Ear, nose and throat: No hearing loss, ear pain, nasal congestion, sore throat Cardiovascular: No chest pain, palpitations Respiratory:  No shortness of breath at rest or with exertion.   No wheezes GastrointestinaI: No nausea, vomiting, diarrhea, abdominal pain, fecal incontinence Genitourinary:  No dysuria, urinary retention or frequency.  No nocturia. Musculoskeletal:  No neck pain, back pain Integumentary: No rash, pruritus, skin lesions Neurological: as above Psychiatric: No depression at this time.  No anxiety Endocrine: No palpitations, diaphoresis, change in appetite, change in weigh or increased thirst Hematologic/Lymphatic:  No anemia, purpura, petechiae. Allergic/Immunologic: No itchy/runny eyes, nasal congestion, recent allergic reactions, rashes  ALLERGIES: Allergies  Allergen Reactions   Amoxicillin     Abdominal cramping and diarrhea    HOME MEDICATIONS:  Current Outpatient Medications:    ACCU-CHEK AVIVA PLUS test strip, , Disp: , Rfl:    amLODipine (NORVASC) 5 MG tablet, TAKE 1 TABLET BY MOUTH DAILY, Disp: 90 tablet, Rfl: 1   gabapentin (NEURONTIN) 100 MG capsule, Take 2 capsules (200 mg total) by mouth 3 (three) times daily., Disp: 180 capsule, Rfl: 5   glipiZIDE (  GLUCOTROL XL) 10 MG 24 hr tablet, Take 10 mg by mouth daily., Disp: , Rfl:    HUMALOG 100 UNIT/ML cartridge, , Disp: , Rfl:    ibuprofen (ADVIL) 200 MG tablet, Take 200 mg by mouth every 6 (six) hours as needed., Disp: , Rfl:    lamoTRIgine (LAMICTAL) 25 MG tablet, One po qd x 5 days, then one po bid x 5 days, then one po tid x 5 days, then 2 po bid, Disp: 120 tablet, Rfl: 5   Lancets (ACCU-CHEK MULTICLIX) lancets, , Disp: , Rfl:    lidocaine (XYLOCAINE) 5 % ointment, Apply 1 application. topically as needed. Apply one inch up to twice a day,  Disp: 35.44 g, Rfl: 5   Melatonin 10 MG CAPS, Take by mouth., Disp: , Rfl:    metoprolol (LOPRESSOR) 100 MG tablet, Take 1 tablet (100 mg total) by mouth daily., Disp: 90 tablet, Rfl: 3   Omega-3 Fatty Acids (FISH OIL PO), Take 90 mg by mouth., Disp: , Rfl:    simvastatin (ZOCOR) 40 MG tablet, Take one half tablet by mouth daily., Disp: 45 tablet, Rfl: 3   SYNJARDY XR 25-1000 MG TB24, Take 1 tablet by mouth daily., Disp: , Rfl:    TRESIBA FLEXTOUCH 200 UNIT/ML SOPN, INJECT 40 TO 50 UNITS INTO THE SKIN EVERY NIGHT AT BEDTIME, Disp: , Rfl: 5   valACYclovir (VALTREX) 1000 MG tablet, valacyclovir 1 gram tablet, Disp: , Rfl:    valsartan-hydrochlorothiazide (DIOVAN-HCT) 320-12.5 MG tablet, TAKE 1 TABLET BY MOUTH EVERY DAY, Disp: 30 tablet, Rfl: 0  PAST MEDICAL HISTORY: Past Medical History:  Diagnosis Date   Anemia    Arthritis    Diabetes mellitus    Diverticulosis    GERD (gastroesophageal reflux disease)    Hyperlipidemia    Hypertension    IBS (irritable bowel syndrome)    Kidney stones     PAST SURGICAL HISTORY: Past Surgical History:  Procedure Laterality Date   ABDOMINAL EXPLORATION SURGERY  1970   ABDOMINAL HYSTERECTOMY     BREAST LUMPECTOMY     CYSTOCELE REPAIR     skin graph      FAMILY HISTORY: Family History  Problem Relation Age of Onset   Liver cancer Mother    Ovarian cancer Mother    Diabetes Mother    Cancer Mother        ovarian   Diabetes Father    Heart disease Father    Heart disease Brother    Early death Neg Hx    Hyperlipidemia Neg Hx    Hypertension Neg Hx    Kidney disease Neg Hx    Stroke Neg Hx    Alcohol abuse Neg Hx    Arthritis Neg Hx     SOCIAL HISTORY:  Social History   Socioeconomic History   Marital status: Married    Spouse name: Yvone Neu   Number of children: 1   Years of education: Not on file   Highest education level: Associate degree: occupational, Hotel manager, or vocational program  Occupational History   Occupation: nurse   Tobacco Use   Smoking status: Never   Smokeless tobacco: Never  Substance and Sexual Activity   Alcohol use: No   Drug use: No   Sexual activity: Yes    Birth control/protection: Post-menopausal  Other Topics Concern   Not on file  Social History Narrative   Lives at home with husband   R handed   Caffeine: 2 C of coffee a day.  Social Determinants of Health   Financial Resource Strain: Not on file  Food Insecurity: Not on file  Transportation Needs: Not on file  Physical Activity: Not on file  Stress: Not on file  Social Connections: Not on file  Intimate Partner Violence: Not on file     PHYSICAL EXAM  Vitals:   05/10/22 1445  BP: (!) 114/53  Pulse: 72  Weight: 134 lb 8 oz (61 kg)  Height: '5\' 1"'$  (1.549 m)    Body mass index is 25.41 kg/m.   General: The patient is well-developed and well-nourished and in no acute distress  HEENT:  Head is [place]/AT.  Sclera are anicteric.    Skin: Extremities are without rash or  edema.  Musculoskeletal:  mild costochondral tenderness on right  Neurologic Exam  Mental status: The patient is alert and oriented x 3 at the time of the examination. The patient has apparent normal recent and remote memory, with an apparently normal attention span and concentration ability.   Speech is normal.  Cranial nerves: Extraocular movements are full. Facial strength is normal.   Motor:  Muscle bulk is normal.   Tone is normal. Strength is  5 / 5 in all 4 extremities.   Sensory:   she has allodynia in right flank to lower/under breast (T5 and/or T6 dermatome).     Gait and station: Station is normal.   Gait is normal. Tandem gait is mildly wide (normal for age).    Reflexes: Deep tendon reflexes are symmetric and normal in arms, 1 at knees absent at ankles.       DIAGNOSTIC DATA (LABS, IMAGING, TESTING) - I reviewed patient records, labs, notes, testing and imaging myself where available.  Lab Results  Component Value Date   WBC  10.0 07/25/2013   HGB 12.2 07/25/2013   HCT 36.3 07/25/2013   MCV 84.3 07/25/2013   PLT 418.0 (H) 07/25/2013      Component Value Date/Time   NA 142 02/15/2016 1542   K 4.0 02/15/2016 1542   CL 102 02/15/2016 1542   CO2 29 02/15/2016 1542   GLUCOSE 178 (H) 02/15/2016 1542   BUN 18 02/15/2016 1542   CREATININE 0.68 02/15/2016 1542   CALCIUM 9.8 02/15/2016 1542   PROT 7.2 02/15/2016 1542   ALBUMIN 4.4 02/15/2016 1542   AST 14 02/15/2016 1542   ALT 14 02/15/2016 1542   ALKPHOS 62 02/15/2016 1542   BILITOT 0.3 02/15/2016 1542   GFRNONAA >60 06/20/2011 0448   GFRAA >60 06/20/2011 0448   Lab Results  Component Value Date   CHOL 165 02/15/2016   HDL 45.90 02/15/2016   LDLDIRECT 85.0 02/15/2016   TRIG 227.0 (H) 02/15/2016   CHOLHDL 4 02/15/2016   Lab Results  Component Value Date   HGBA1C 9.0 (H) 02/15/2016   Lab Results  Component Value Date   VITAMINB12 419 07/25/2013   Lab Results  Component Value Date   TSH 2.48 02/15/2016       ASSESSMENT AND PLAN  Postherpetic neuralgia - Plan: Comprehensive metabolic panel  High risk medication use - Plan: Comprehensive metabolic panel  Dysesthesia   Lamotrigine (titrate to 50 mg po bid) .  If pain worsens, consider MRI thoracic Ok to switch from NSAID to acetaminophen.  We will check CMP to determine if any kidney or liver issue (has IDDM type 2 DM) RTC prn.  Call if not better.        Jessel Gettinger A. Felecia Shelling, MD, [facility] 05/10/2022, 3:02 PM  Certified in Neurology, [place] Neurophysiology, Sleep Medicine and Neuroimaging  [facility] [address] [place], [place] [address] [phone]

## 2022-05-10 NOTE — Patient Instructions (Signed)
The pharmacy has a prescription for lamotrigine 25 mg tablets.  For 5 days take 1 daily. For the next 5 days, take 1 pill twice a day For the next 5 days, take 1 pill 3 times a day Then, take 2 pills twice a day  Most people tolerate it well.  If you get a rash stop the medication.

## 2022-05-11 LAB — COMPREHENSIVE METABOLIC PANEL
ALT: 27 IU/L (ref 0–32)
AST: 22 IU/L (ref 0–40)
Albumin/Globulin Ratio: 1.8 (ref 1.2–2.2)
Albumin: 4.6 g/dL (ref 3.7–4.7)
Alkaline Phosphatase: 73 IU/L (ref 44–121)
BUN/Creatinine Ratio: 15 (ref 12–28)
BUN: 21 mg/dL (ref 8–27)
Bilirubin Total: 0.2 mg/dL (ref 0.0–1.2)
CO2: 25 mmol/L (ref 20–29)
Calcium: 9.4 mg/dL (ref 8.7–10.3)
Chloride: 100 mmol/L (ref 96–106)
Creatinine, Ser: 1.44 mg/dL — ABNORMAL HIGH (ref 0.57–1.00)
Globulin, Total: 2.5 g/dL (ref 1.5–4.5)
Glucose: 247 mg/dL — ABNORMAL HIGH (ref 70–99)
Potassium: 5.2 mmol/L (ref 3.5–5.2)
Sodium: 141 mmol/L (ref 134–144)
Total Protein: 7.1 g/dL (ref 6.0–8.5)
eGFR: 37 mL/min/{1.73_m2} — ABNORMAL LOW (ref >59)

## 2022-05-18 ENCOUNTER — Telehealth: Payer: Self-pay | Admitting: Neurology

## 2022-05-18 NOTE — Telephone Encounter (Signed)
Pt is asking for a call on [phone] with results to lab work from 05-31

## 2022-05-18 NOTE — Telephone Encounter (Signed)
Dr. Felecia Shelling sent pt mychart message about results on 05/11/22:  "The labs show: Your creatinine which is a test of kidney function was elevated so the kidneys are not working as well as they did in the past.  Try to limit the anti-inflammatory agents like Motrin. The liver labs look good. Your glucose was elevated at 247.  You were not fasting for this lab so it is hard to compare to previous ones"  I called pt. She does not have access to [employer]. Relayed results above. She verbalized understanding. She stopped taking gabapentin/could not tolerate. Never picked up lamotrigine (was concerned about SE and did not feel she needed/was in enough pain). She stopped taking Advil this past Sunday (previously taking 6 per day). She is doing well. She has PCP appt 06/10/22 and she will have them recheck labs to continue to monitor. I updated med list. She will call in the future if anything else is needed

## 2023-11-15 ENCOUNTER — Other Ambulatory Visit (HOSPITAL_COMMUNITY): Payer: Self-pay | Admitting: Internal Medicine

## 2023-11-15 ENCOUNTER — Ambulatory Visit (HOSPITAL_COMMUNITY)
Admission: RE | Admit: 2023-11-15 | Discharge: 2023-11-15 | Disposition: A | Discharge status: Home / Self Care | Payer: Medicare HMO | Source: Ambulatory Visit | Attending: Internal Medicine | Admitting: Internal Medicine

## 2023-11-15 DIAGNOSIS — M792 Neuralgia and neuritis, unspecified: Secondary | ICD-10-CM | POA: Insufficient documentation
